# Patient Record
Sex: Female | Born: 1940 | Race: White | Hispanic: No | Marital: Married | Smoking: Never smoker
Health system: Southern US, Community
[De-identification: ages and names within clinical notes are randomized; demographics above are authoritative.]

## PROBLEM LIST (undated history)

## (undated) DIAGNOSIS — J9621 Acute and chronic respiratory failure with hypoxia: Secondary | ICD-10-CM

## (undated) DIAGNOSIS — N184 Chronic kidney disease, stage 4 (severe): Secondary | ICD-10-CM

## (undated) DIAGNOSIS — N189 Chronic kidney disease, unspecified: Secondary | ICD-10-CM

## (undated) DIAGNOSIS — E119 Type 2 diabetes mellitus without complications: Secondary | ICD-10-CM

## (undated) DIAGNOSIS — I482 Chronic atrial fibrillation, unspecified: Secondary | ICD-10-CM

## (undated) DIAGNOSIS — I4891 Unspecified atrial fibrillation: Secondary | ICD-10-CM

## (undated) DIAGNOSIS — R0489 Hemorrhage from other sites in respiratory passages: Secondary | ICD-10-CM

## (undated) DIAGNOSIS — I5033 Acute on chronic diastolic (congestive) heart failure: Secondary | ICD-10-CM

## (undated) DIAGNOSIS — I10 Essential (primary) hypertension: Secondary | ICD-10-CM

## (undated) HISTORY — PX: EYE SURGERY: SHX253

## (undated) HISTORY — PX: ATRIAL ABLATION SURGERY: SHX560

## (undated) HISTORY — PX: THYROID SURGERY: SHX805

---

## 2018-07-01 ENCOUNTER — Other Ambulatory Visit (HOSPITAL_COMMUNITY): Payer: No Typology Code available for payment source

## 2018-07-01 ENCOUNTER — Inpatient Hospital Stay
Admission: RE | Admit: 2018-07-01 | Discharge: 2018-07-02 | Disposition: A | Discharge status: Other Acute Inpatient Hospital | Payer: No Typology Code available for payment source | Source: Home / Self Care | Attending: Internal Medicine | Admitting: Internal Medicine

## 2018-07-01 DIAGNOSIS — Z9911 Dependence on respirator [ventilator] status: Secondary | ICD-10-CM

## 2018-07-01 DIAGNOSIS — I129 Hypertensive chronic kidney disease with stage 1 through stage 4 chronic kidney disease, or unspecified chronic kidney disease: Secondary | ICD-10-CM | POA: Diagnosis not present

## 2018-07-01 DIAGNOSIS — N189 Chronic kidney disease, unspecified: Secondary | ICD-10-CM | POA: Diagnosis not present

## 2018-07-01 DIAGNOSIS — R140 Abdominal distension (gaseous): Secondary | ICD-10-CM | POA: Diagnosis present

## 2018-07-01 DIAGNOSIS — Z931 Gastrostomy status: Secondary | ICD-10-CM

## 2018-07-01 DIAGNOSIS — J9690 Respiratory failure, unspecified, unspecified whether with hypoxia or hypercapnia: Secondary | ICD-10-CM

## 2018-07-01 DIAGNOSIS — E1122 Type 2 diabetes mellitus with diabetic chronic kidney disease: Secondary | ICD-10-CM | POA: Diagnosis not present

## 2018-07-01 DIAGNOSIS — Z90710 Acquired absence of both cervix and uterus: Secondary | ICD-10-CM

## 2018-07-01 DIAGNOSIS — K9423 Gastrostomy malfunction: Secondary | ICD-10-CM | POA: Diagnosis not present

## 2018-07-01 LAB — BLOOD GAS, ARTERIAL
Acid-Base Excess: 7.6 mmol/L — ABNORMAL HIGH (ref 0.0–2.0)
Bicarbonate: 31.1 mmol/L — ABNORMAL HIGH (ref 20.0–28.0)
FIO2: 30
O2 Saturation: 95.4 %
PEEP: 5 cmH2O
Patient temperature: 97.8
Pressure control: 14 cmH2O
RATE: 14 resp/min
pCO2 arterial: 38.8 mmHg (ref 32.0–48.0)
pH, Arterial: 7.512 — ABNORMAL HIGH (ref 7.350–7.450)
pO2, Arterial: 71 mmHg — ABNORMAL LOW (ref 83.0–108.0)

## 2018-07-01 MED ORDER — IOHEXOL 300 MG/ML  SOLN
50.0000 mL | Freq: Once | INTRAMUSCULAR | Status: AC | PRN
  Administered 2018-07-01: 50 mL via INTRATHECAL

## 2018-07-02 ENCOUNTER — Inpatient Hospital Stay
Admission: RE | Admit: 2018-07-02 | Discharge: 2018-07-28 | Disposition: A | Discharge status: Skilled Nursing Facility | Payer: Medicare Other | Source: Other Acute Inpatient Hospital | Attending: Internal Medicine | Admitting: Internal Medicine

## 2018-07-02 ENCOUNTER — Other Ambulatory Visit: Payer: Self-pay

## 2018-07-02 ENCOUNTER — Other Ambulatory Visit (HOSPITAL_COMMUNITY): Payer: No Typology Code available for payment source

## 2018-07-02 ENCOUNTER — Emergency Department (HOSPITAL_COMMUNITY)
Admission: EM | Admit: 2018-07-02 | Discharge: 2018-07-02 | Disposition: A | Discharge status: Home / Self Care | Payer: No Typology Code available for payment source | Attending: Emergency Medicine | Admitting: Emergency Medicine

## 2018-07-02 ENCOUNTER — Encounter (HOSPITAL_COMMUNITY): Payer: Self-pay | Admitting: Emergency Medicine

## 2018-07-02 DIAGNOSIS — N189 Chronic kidney disease, unspecified: Secondary | ICD-10-CM | POA: Insufficient documentation

## 2018-07-02 DIAGNOSIS — K567 Ileus, unspecified: Secondary | ICD-10-CM

## 2018-07-02 DIAGNOSIS — K668 Other specified disorders of peritoneum: Secondary | ICD-10-CM

## 2018-07-02 DIAGNOSIS — I5033 Acute on chronic diastolic (congestive) heart failure: Secondary | ICD-10-CM | POA: Diagnosis present

## 2018-07-02 DIAGNOSIS — E1122 Type 2 diabetes mellitus with diabetic chronic kidney disease: Secondary | ICD-10-CM | POA: Insufficient documentation

## 2018-07-02 DIAGNOSIS — J9621 Acute and chronic respiratory failure with hypoxia: Secondary | ICD-10-CM | POA: Diagnosis present

## 2018-07-02 DIAGNOSIS — I129 Hypertensive chronic kidney disease with stage 1 through stage 4 chronic kidney disease, or unspecified chronic kidney disease: Secondary | ICD-10-CM | POA: Insufficient documentation

## 2018-07-02 DIAGNOSIS — Z931 Gastrostomy status: Secondary | ICD-10-CM | POA: Insufficient documentation

## 2018-07-02 DIAGNOSIS — N289 Disorder of kidney and ureter, unspecified: Secondary | ICD-10-CM

## 2018-07-02 DIAGNOSIS — I482 Chronic atrial fibrillation, unspecified: Secondary | ICD-10-CM | POA: Diagnosis present

## 2018-07-02 DIAGNOSIS — R0489 Hemorrhage from other sites in respiratory passages: Secondary | ICD-10-CM | POA: Diagnosis present

## 2018-07-02 DIAGNOSIS — N184 Chronic kidney disease, stage 4 (severe): Secondary | ICD-10-CM | POA: Diagnosis present

## 2018-07-02 DIAGNOSIS — K9423 Gastrostomy malfunction: Secondary | ICD-10-CM

## 2018-07-02 DIAGNOSIS — J449 Chronic obstructive pulmonary disease, unspecified: Secondary | ICD-10-CM

## 2018-07-02 HISTORY — DX: Acute on chronic diastolic (congestive) heart failure: I50.33

## 2018-07-02 HISTORY — DX: Type 2 diabetes mellitus without complications: E11.9

## 2018-07-02 HISTORY — DX: Acute and chronic respiratory failure with hypoxia: J96.21

## 2018-07-02 HISTORY — DX: Chronic kidney disease, stage 4 (severe): N18.4

## 2018-07-02 HISTORY — DX: Unspecified atrial fibrillation: I48.91

## 2018-07-02 HISTORY — DX: Chronic atrial fibrillation, unspecified: I48.20

## 2018-07-02 HISTORY — DX: Essential (primary) hypertension: I10

## 2018-07-02 HISTORY — DX: Hemorrhage from other sites in respiratory passages: R04.89

## 2018-07-02 HISTORY — DX: Chronic kidney disease, unspecified: N18.9

## 2018-07-02 LAB — CBC WITH DIFFERENTIAL/PLATELET
Abs Immature Granulocytes: 0.55 10*3/uL — ABNORMAL HIGH (ref 0.00–0.07)
Basophils Absolute: 0.1 10*3/uL (ref 0.0–0.1)
Basophils Relative: 0 %
Eosinophils Absolute: 0.8 10*3/uL — ABNORMAL HIGH (ref 0.0–0.5)
Eosinophils Relative: 5 %
HCT: 27.8 % — ABNORMAL LOW (ref 36.0–46.0)
Hemoglobin: 8.6 g/dL — ABNORMAL LOW (ref 12.0–15.0)
Immature Granulocytes: 4 %
Lymphocytes Relative: 11 %
Lymphs Abs: 1.6 10*3/uL (ref 0.7–4.0)
MCH: 29.2 pg (ref 26.0–34.0)
MCHC: 30.9 g/dL (ref 30.0–36.0)
MCV: 94.2 fL (ref 80.0–100.0)
Monocytes Absolute: 0.9 10*3/uL (ref 0.1–1.0)
Monocytes Relative: 6 %
Neutro Abs: 10.8 10*3/uL — ABNORMAL HIGH (ref 1.7–7.7)
Neutrophils Relative %: 74 %
Platelets: 362 10*3/uL (ref 150–400)
RBC: 2.95 MIL/uL — ABNORMAL LOW (ref 3.87–5.11)
RDW: 16.3 % — ABNORMAL HIGH (ref 11.5–15.5)
WBC: 14.6 10*3/uL — ABNORMAL HIGH (ref 4.0–10.5)
nRBC: 0 % (ref 0.0–0.2)

## 2018-07-02 LAB — COMPREHENSIVE METABOLIC PANEL
ALT: 22 U/L (ref 0–44)
AST: 19 U/L (ref 15–41)
Albumin: 2.3 g/dL — ABNORMAL LOW (ref 3.5–5.0)
Alkaline Phosphatase: 64 U/L (ref 38–126)
Anion gap: 15 (ref 5–15)
BUN: 45 mg/dL — ABNORMAL HIGH (ref 8–23)
CO2: 26 mmol/L (ref 22–32)
Calcium: 9.4 mg/dL (ref 8.9–10.3)
Chloride: 97 mmol/L — ABNORMAL LOW (ref 98–111)
Creatinine, Ser: 1.88 mg/dL — ABNORMAL HIGH (ref 0.44–1.00)
GFR calc Af Amer: 30 mL/min — ABNORMAL LOW (ref >60)
GFR calc non Af Amer: 25 mL/min — ABNORMAL LOW (ref >60)
Glucose, Bld: 147 mg/dL — ABNORMAL HIGH (ref 70–99)
Potassium: 3.7 mmol/L (ref 3.5–5.1)
Sodium: 138 mmol/L (ref 135–145)
Total Bilirubin: 0.9 mg/dL (ref 0.3–1.2)
Total Protein: 5.9 g/dL — ABNORMAL LOW (ref 6.5–8.1)

## 2018-07-02 LAB — PROTIME-INR
INR: 1.4 — ABNORMAL HIGH (ref 0.8–1.2)
Prothrombin Time: 16.7 seconds — ABNORMAL HIGH (ref 11.4–15.2)

## 2018-07-02 LAB — MAGNESIUM: Magnesium: 2.1 mg/dL (ref 1.7–2.4)

## 2018-07-02 LAB — PHOSPHORUS: Phosphorus: 3.7 mg/dL (ref 2.5–4.6)

## 2018-07-02 MED ORDER — PRAVASTATIN SODIUM 40 MG PO TABS
40.00 | ORAL_TABLET | ORAL | Status: DC
Start: 2018-07-01 — End: 2018-07-02

## 2018-07-02 MED ORDER — INSULIN GLARGINE 100 UNIT/ML ~~LOC~~ SOLN
1.00 | SUBCUTANEOUS | Status: DC
Start: ? — End: 2018-07-02

## 2018-07-02 MED ORDER — HEPARIN LOCK FLUSH 10 UNIT/ML IV SOLN
5.00 | INTRAVENOUS | Status: DC
Start: 2018-07-02 — End: 2018-07-02

## 2018-07-02 MED ORDER — GENERIC EXTERNAL MEDICATION
250.00 | Status: DC
Start: 2018-07-01 — End: 2018-07-02

## 2018-07-02 MED ORDER — TIMOLOL MALEATE 0.5 % OP SOLN
1.00 | OPHTHALMIC | Status: DC
Start: 2018-07-01 — End: 2018-07-02

## 2018-07-02 MED ORDER — FENOFIBRATE 54 MG PO TABS
54.00 | ORAL_TABLET | ORAL | Status: DC
Start: 2018-07-02 — End: 2018-07-02

## 2018-07-02 MED ORDER — APIXABAN 2.5 MG PO TABS
2.50 | ORAL_TABLET | ORAL | Status: DC
Start: 2018-07-01 — End: 2018-07-02

## 2018-07-02 MED ORDER — POLYETHYLENE GLYCOL 3350 17 G PO PACK
17.00 | PACK | ORAL | Status: DC
Start: ? — End: 2018-07-02

## 2018-07-02 MED ORDER — CLOTRIMAZOLE-BETAMETHASONE 1-0.05 % EX CREA
TOPICAL_CREAM | CUTANEOUS | Status: DC
Start: 2018-07-01 — End: 2018-07-02

## 2018-07-02 MED ORDER — LIDOCAINE HCL 2 % IJ SOLN
0.00 | INTRAMUSCULAR | Status: DC
Start: ? — End: 2018-07-02

## 2018-07-02 MED ORDER — GENERIC EXTERNAL MEDICATION
Status: DC
Start: ? — End: 2018-07-02

## 2018-07-02 MED ORDER — DILTIAZEM HCL 30 MG PO TABS
30.00 | ORAL_TABLET | ORAL | Status: DC
Start: 2018-07-01 — End: 2018-07-02

## 2018-07-02 MED ORDER — VITAMIN D3 25 MCG (1000 UT) PO TABS
1000.00 | ORAL_TABLET | ORAL | Status: DC
Start: 2018-07-02 — End: 2018-07-02

## 2018-07-02 MED ORDER — INSULIN GLARGINE 100 UNIT/ML ~~LOC~~ SOLN
1.00 | SUBCUTANEOUS | Status: DC
Start: 2018-07-01 — End: 2018-07-02

## 2018-07-02 MED ORDER — ACETAMINOPHEN 325 MG PO TABS
650.00 | ORAL_TABLET | ORAL | Status: DC
Start: ? — End: 2018-07-02

## 2018-07-02 MED ORDER — NITROGLYCERIN 0.4 MG SL SUBL
.40 | SUBLINGUAL_TABLET | SUBLINGUAL | Status: DC
Start: ? — End: 2018-07-02

## 2018-07-02 MED ORDER — FIRST-LANSOPRAZOLE 3 MG/ML PO SUSP
30.00 | ORAL | Status: DC
Start: 2018-07-02 — End: 2018-07-02

## 2018-07-02 MED ORDER — HYDRALAZINE HCL 50 MG PO TABS
100.00 | ORAL_TABLET | ORAL | Status: DC
Start: 2018-07-01 — End: 2018-07-02

## 2018-07-02 MED ORDER — METOPROLOL TARTRATE 5 MG/5ML IV SOLN
5.00 | INTRAVENOUS | Status: DC
Start: ? — End: 2018-07-02

## 2018-07-02 MED ORDER — DEXMEDETOMIDINE HCL IN NACL 400 MCG/100ML IV SOLN
0.10 | INTRAVENOUS | Status: DC
Start: ? — End: 2018-07-02

## 2018-07-02 MED ORDER — SENNA-DOCUSATE SODIUM 8.6-50 MG PO TABS
1.00 | ORAL_TABLET | ORAL | Status: DC
Start: ? — End: 2018-07-02

## 2018-07-02 MED ORDER — GENERIC EXTERNAL MEDICATION
30.00 | Status: DC
Start: ? — End: 2018-07-02

## 2018-07-02 MED ORDER — CLONAZEPAM 0.5 MG PO TABS
.50 | ORAL_TABLET | ORAL | Status: DC
Start: 2018-07-01 — End: 2018-07-02

## 2018-07-02 MED ORDER — GUAIFENESIN 100 MG/5ML PO LIQD
400.00 | ORAL | Status: DC
Start: 2018-07-01 — End: 2018-07-02

## 2018-07-02 MED ORDER — IPRATROPIUM-ALBUTEROL 0.5-2.5 (3) MG/3ML IN SOLN
3.00 | RESPIRATORY_TRACT | Status: DC
Start: ? — End: 2018-07-02

## 2018-07-02 MED ORDER — ACETAMINOPHEN 650 MG RE SUPP
650.00 | RECTAL | Status: DC
Start: ? — End: 2018-07-02

## 2018-07-02 MED ORDER — MIDAZOLAM HCL 2 MG/2ML IJ SOLN
2.00 | INTRAMUSCULAR | Status: DC
Start: ? — End: 2018-07-02

## 2018-07-02 MED ORDER — LATANOPROST 0.005 % OP SOLN
1.00 | OPHTHALMIC | Status: DC
Start: 2018-07-01 — End: 2018-07-02

## 2018-07-02 MED ORDER — INSULIN LISPRO 100 UNIT/ML ~~LOC~~ SOLN
1.00 | SUBCUTANEOUS | Status: DC
Start: ? — End: 2018-07-02

## 2018-07-02 MED ORDER — NYSTATIN 100000 UNIT/ML MT SUSP
500000.00 | OROMUCOSAL | Status: DC
Start: 2018-07-01 — End: 2018-07-02

## 2018-07-02 MED ORDER — TROPICAL LIQUID NUTRITION PO LIQD
5.00 | ORAL | Status: DC
Start: 2018-07-02 — End: 2018-07-02

## 2018-07-02 MED ORDER — CARVEDILOL 6.25 MG PO TABS
6.25 | ORAL_TABLET | ORAL | Status: DC
Start: 2018-07-01 — End: 2018-07-02

## 2018-07-02 MED ORDER — FENTANYL CITRATE (PF) 2500 MCG/50ML IJ SOLN
25.00 | INTRAMUSCULAR | Status: DC
Start: ? — End: 2018-07-02

## 2018-07-02 MED ORDER — FUROSEMIDE 10 MG/ML IJ SOLN
60.00 | INTRAMUSCULAR | Status: DC
Start: 2018-07-01 — End: 2018-07-02

## 2018-07-02 MED ORDER — DEXTROSE 10 % IV SOLN
50.00 | INTRAVENOUS | Status: DC
Start: ? — End: 2018-07-02

## 2018-07-02 MED ORDER — ALBUTEROL SULFATE (2.5 MG/3ML) 0.083% IN NEBU
2.50 | INHALATION_SOLUTION | RESPIRATORY_TRACT | Status: DC
Start: ? — End: 2018-07-02

## 2018-07-02 MED ORDER — ONDANSETRON HCL 4 MG/2ML IJ SOLN
4.00 | INTRAMUSCULAR | Status: DC
Start: ? — End: 2018-07-02

## 2018-07-02 MED ORDER — INSULIN LISPRO 100 UNIT/ML ~~LOC~~ SOLN
1.00 | SUBCUTANEOUS | Status: DC
Start: 2018-07-01 — End: 2018-07-02

## 2018-07-02 MED ORDER — SODIUM CHLORIDE 0.9 % IV SOLN
10.00 | INTRAVENOUS | Status: DC
Start: ? — End: 2018-07-02

## 2018-07-02 MED ORDER — MELATONIN 3 MG PO TABS
3.00 | ORAL_TABLET | ORAL | Status: DC
Start: ? — End: 2018-07-02

## 2018-07-02 MED ORDER — FOLIC ACID 1 MG PO TABS
.50 | ORAL_TABLET | ORAL | Status: DC
Start: 2018-07-02 — End: 2018-07-02

## 2018-07-02 NOTE — Consult Note (Signed)
Reason for Consult: free air Referring Physician: Dairl Ponder [name] is an 78 y.o. female.  HPI: 78 yo female with long standing heart failure who has been hospitalized at [facility] and then [place] for more than a month at each this year. She was recently transferred to [facility] for long term acute care. On initial g tube study free air was identified. The team ordered a follow up CT scan showing free air around the liver without any specific findings of wall thickening of bowel or fluid. She was transferred to the ER for this finding. Due to EMR limitations, it is unclear what her vitals and laboratory values were over the last 24h.  Past Medical History:  Diagnosis Date   Atrial fibrillation ([place])    Chronic kidney disease (CKD)    Diabetes mellitus ([place])    Hypertension     Past Surgical History:  Procedure Laterality Date   ATRIAL ABLATION SURGERY     EYE SURGERY     THYROID SURGERY      No family history on file.  Social History:  reports that she has never smoked. She has never used smokeless tobacco. She reports that she does not drink alcohol or use drugs.  Allergies:  Allergies  Allergen Reactions   Cefazolin    Saxagliptin    Sulfamethoxazole    Zosyn [Piperacillin Sod-Tazobactam So]     Medications: I have reviewed the patient's current medications.  Results for orders placed or performed during the hospital encounter of 07/01/18 (from the past 48 hour(s))  Blood gas, arterial     Status: Abnormal   Collection Time: 07/01/18  5:55 PM  Result Value Ref Range   FIO2 30.00    Delivery systems VENTILATOR    Mode PRESSURE CONTROL    LHR 14 resp/min   Peep/cpap 5.0 cm H20   Pressure control 14 cm H20   pH, Arterial 7.512 (H) 7.350 - 7.450   pCO2 arterial 38.8 32.0 - 48.0 mmHg   pO2, Arterial 71.0 (L) 83.0 - 108.0 mmHg   Bicarbonate 31.1 (H) 20.0 - 28.0 mmol/L   Acid-Base Excess 7.6 (H) 0.0 - 2.0 mmol/L   O2 Saturation 95.4 %   Patient temperature 97.8    Collection site LEFT RADIAL    Drawn by COLLECTED BY RT    Sample type ARTERIAL DRAW    Allens test (pass/fail) PASS PASS  Comprehensive metabolic panel     Status: Abnormal   Collection Time: 07/02/18  5:28 AM  Result Value Ref Range   Sodium 138 135 - 145 mmol/L   Potassium 3.7 3.5 - 5.1 mmol/L   Chloride 97 (L) 98 - 111 mmol/L   CO2 26 22 - 32 mmol/L   Glucose, Bld 147 (H) 70 - 99 mg/dL   BUN 45 (H) 8 - 23 mg/dL   Creatinine, Ser 1.88 (H) 0.44 - 1.00 mg/dL   Calcium 9.4 8.9 - 10.3 mg/dL   Total Protein 5.9 (L) 6.5 - 8.1 g/dL   Albumin 2.3 (L) 3.5 - 5.0 g/dL   AST 19 15 - 41 U/L   ALT 22 0 - 44 U/L   Alkaline Phosphatase 64 38 - 126 U/L   Total Bilirubin 0.9 0.3 - 1.2 mg/dL   GFR calc non Af Amer 25 (L) >60 mL/min   GFR calc Af Amer 30 (L) >60 mL/min   Anion gap 15 5 - 15    Comment: Performed at [facility], [address] [place], [place] [address]  CBC with Differential/Platelet     Status: Abnormal   Collection Time: 07/02/18  5:28 AM  Result Value Ref Range   WBC 14.6 (H) 4.0 - 10.5 K/uL   RBC 2.95 (L) 3.87 - 5.11 MIL/uL   Hemoglobin 8.6 (L) 12.0 - 15.0 g/dL   HCT 27.8 (L) 36.0 - 46.0 %   MCV 94.2 80.0 - 100.0 fL   MCH 29.2 26.0 - 34.0 pg   MCHC 30.9 30.0 - 36.0 g/dL   RDW 16.3 (H) 11.5 - 15.5 %   Platelets 362 150 - 400 K/uL   nRBC 0.0 0.0 - 0.2 %   Neutrophils Relative % 74 %   Neutro Abs 10.8 (H) 1.7 - 7.7 K/uL   Lymphocytes Relative 11 %   Lymphs Abs 1.6 0.7 - 4.0 K/uL   Monocytes Relative 6 %   Monocytes Absolute 0.9 0.1 - 1.0 K/uL   Eosinophils Relative 5 %   Eosinophils Absolute 0.8 (H) 0.0 - 0.5 K/uL   Basophils Relative 0 %   Basophils Absolute 0.1 0.0 - 0.1 K/uL   Immature Granulocytes 4 %   Abs Immature Granulocytes 0.55 (H) 0.00 - 0.07 K/uL    Comment: Performed at [facility] [address] [place], [place] [address]  Protime-INR     Status: Abnormal   Collection Time: 07/02/18  5:28 AM  Result  Value Ref Range   Prothrombin Time 16.7 (H) 11.4 - 15.2 seconds   INR 1.4 (H) 0.8 - 1.2    Comment: (NOTE) INR goal varies based on device and disease states. Performed at [facility], [place] [address] [place], [place] [address]   Magnesium     Status: None   Collection Time: 07/02/18  5:28 AM  Result Value Ref Range   Magnesium 2.1 1.7 - 2.4 mg/dL    Comment: Performed at [facility] [address] [place], [place] [address]  Phosphorus     Status: None   Collection Time: 07/02/18  5:28 AM  Result Value Ref Range   Phosphorus 3.7 2.5 - 4.6 mg/dL    Comment: Performed at [facility] [address], [place], [place] [address]    Ct Abdomen Pelvis Wo Contrast  Result Date: 07/02/2018 CLINICAL DATA:  Follow-up KUB, abdominal distension. EXAM: CT ABDOMEN AND PELVIS WITHOUT CONTRAST TECHNIQUE: Multidetector CT imaging of the abdomen and pelvis was performed following the standard protocol without IV contrast. COMPARISON:  Plain film of the abdomen dated 07/01/2018. FINDINGS: Lower chest: Patchy bibasilar consolidations, LEFT greater than RIGHT. Hepatobiliary: Single small stone within the otherwise normal-appearing gallbladder. No focal liver abnormality identified. No bile duct dilatation. Pancreas: Unremarkable. No pancreatic ductal dilatation or surrounding inflammatory changes. Spleen: Normal in size without focal abnormality. Adrenals/Urinary Tract: Kidneys are unremarkable without mass, stone or hydronephrosis. Bladder is decompressed by Foley catheter. Stomach/Bowel: No dilated large or small bowel loops. Diverticulosis of the sigmoid colon without convincing evidence of acute diverticulitis. Gastrostomy tube in place, appropriately positioned within the stomach, at the level of the gastric body Vascular/Lymphatic: Extensive aortic atherosclerosis. No enlarged lymph nodes appreciated in the abdomen or pelvis. Reproductive: Uterus and bilateral adnexa are unremarkable.  Other: Small amount of free fluid in the lower pelvis. Large collection of free intraperitoneal air within the upper abdomen, most prominent over the dome of the liver. Musculoskeletal: No acute or suspicious osseous finding. Ill-defined fluid/edema within the subcutaneous soft tissues of  the abdomen and pelvis indicating some degree of anasarca. IMPRESSION: 1. Large collection of free intraperitoneal air within the upper abdomen, most prominent over the dome of the liver. This free intraperitoneal could certainly be related to a recently placed gastrostomy tube, however, there is also free air about the liver hilum raising the possibility of an additional/new bowel perforation at the level of the pylorus or duodenal bulb. Moreover, if the gastrostomy tube has not been placed within the last few days, the amount of air overlying the liver would be considered atypically prominent. 2. Gastrostomy tube in place, appropriately positioned within the stomach, at the level of the gastric body. 3. Colonic diverticulosis without evidence of acute diverticulitis. 4. Cholelithiasis without evidence of acute cholecystitis. 5. Patchy bibasilar consolidations, left greater than right, pneumonia versus aspiration. 6. Anasarca. Aortic Atherosclerosis (ICD10-I70.0). These results were called by telephone at the time of interpretation on 07/02/2018 at 3:05 pm to Dr. Owens Shark, who verbally acknowledged these results. Electronically Signed   By: Franki Cabot M.D.   On: 07/02/2018 15:08   Dg Chest Port 1 View  Result Date: 07/01/2018 CLINICAL DATA:  Cough.  Line placement. EXAM: PORTABLE CHEST 1 VIEW COMPARISON:  None FINDINGS: There is a right-sided PICC line with tip terminating near the cavoatrial junction. The tracheostomy tube terminates above the carina. There is a dual chamber pacemaker in place that appears appropriate. T the cardiac silhouette is enlarged. There is a left basilar airspace opacity. No pneumothorax. There is  free air under the hemidiaphragms. IMPRESSION: 1. Free air under the hemidiaphragms highly suspicious for pneumoperitoneum. Follow-up with CT scan is recommended. 2. Well-positioned right-sided PICC line. The tracheostomy tube terminates above the carina. 3. Cardiomegaly. 4. Left basilar airspace opacity which may represent atelectasis and/or a left-sided pleural effusion. These results were called by telephone at the time of interpretation on 07/01/2018 at 9:05 pm to [place] who verbally acknowledged these results. Electronically Signed   By: Constance Holster M.D.   On: 07/01/2018 21:08   Dg Abd Portable 1v  Result Date: 07/01/2018 CLINICAL DATA:  Peg tube placement EXAM: PORTABLE ABDOMEN - 1 VIEW COMPARISON:  None. FINDINGS: Injection of contrast through the pre-existing PEG tube opacifies the patient's stomach. Multiple bowel loops project over the patient's left lateral abdomen which is likely secondary to significant patient rotation. There appears to be a Rigler's sign involving the gastric wall. IMPRESSION: 1. Potential Rigler's sign involving the gastric wall, highly suspicious for pneumoperitoneum. If there is clinical concern for pneumoperitoneum follow-up with CT is recommended. Correlation with PEG tube placement timing is also recommended as pneumoperitoneum cannot be seen in the post PEG tube placement patient. 2. Injection of contrast through the PEG tube opacifies the stomach. These results were called by telephone at the time of interpretation on 07/01/2018 at 9:03 pm to RN Sheppard Coil who verbally acknowledged these results. Electronically Signed   By: Constance Holster M.D.   On: 07/01/2018 21:00    Review of Systems  Unable to perform ROS: Acuity of condition   Blood pressure (!) 154/97, pulse (!) 129, temperature 98.6 F (37 C), temperature source Oral, resp. rate (!) 22, SpO2 96 %. Physical Exam  Constitutional: She appears well-developed and well-nourished. No  distress.  HENT:  Head: Normocephalic and atraumatic.  Eyes: EOM are normal. Right eye exhibits no discharge. Left eye exhibits no discharge.  Neck: Normal range of motion. Neck supple.  Tracheostomy in place  Cardiovascular: An irregularly irregular rhythm present.  Respiratory: Breath sounds normal.  Assisted with ventilator  GI: Soft.  g tube in place, no erythema  Neurological:  Shakes yes and no to simple questions  Skin: Skin is warm and dry.  Edema in all extremities  Psychiatric:  Somnolent but arousable   Imaging: Reviewed CT scan 5/23 with distended stomach with contrast and free air in upper abdomen. Small simple fluid in pelvis. No inflammation of small or large intestine on CT.  XR 5/22 showing no extravasation of contrast on initial image  Assessment/Plan: 78 yo female recently transferred to [facility] for heart failure and respiratory failure found to have free air. -recommend g tube to drainage -recommend repeat g tube FL study in 24h -may try antibiotics if this is a change from baseline. -ok to return to [facility] for monitoring of this  [place] 07/02/2018, 7:02 PM

## 2018-07-02 NOTE — Discharge Instructions (Addendum)
General surgery recommends putting a G-tube to drainage, repeating a g-tube fluoroscopy study tomorrow, and consider antibiotics.  Send back to the emergency room if she has worsening symptoms.

## 2018-07-02 NOTE — ED Triage Notes (Signed)
[facility] from [facility]. [facility] for respiratory failure. Post CT scan today findings of collection of free air in the upper abdomen.

## 2018-07-02 NOTE — ED Provider Notes (Signed)
[facility] EMERGENCY DEPARTMENT Provider Note   CSN: [id number] Arrival date & time: 07/02/18  1738    History   Chief Complaint Chief Complaint  Patient presents with   Abdominal Injury    HPI Loretta Bell is a 78 y.o. female.     Patient is a 78 year old female who [facility] for free air in the abdomen.  She has a history of atrial fibrillation, chronic kidney disease, diabetes, hypertension.  She had a recent admission for bacteremia to [place].  At that point she had respiratory failure requiring intubation and was unable to wean off the vent.  She had a trach and PEG tube placed.  [facility].  On imaging of her G-tube, it was noted that she had possible free air in the abdomen.  CT scan confirmed these findings and she was sent here for surgical evaluation.     Past Medical History:  Diagnosis Date   Atrial fibrillation ([place])    Chronic kidney disease (CKD)    Diabetes mellitus ([place])    Hypertension     There are no active problems to display for this patient.   Past Surgical History:  Procedure Laterality Date   ATRIAL ABLATION SURGERY     EYE SURGERY     THYROID SURGERY       OB History   No obstetric history on file.      Home Medications    Prior to Admission medications   Not on File    Family History No family history on file.  Social History Social History   Tobacco Use   Smoking status: Never Smoker   Smokeless tobacco: Never Used  Substance Use Topics   Alcohol use: Never    Frequency: Never   Drug use: Never     Allergies   Cefazolin; Saxagliptin; Sulfamethoxazole; and Zosyn [piperacillin sod-tazobactam so]   Review of Systems Review of Systems  Unable to perform ROS: Patient nonverbal     Physical Exam Updated Vital Signs BP (!) 154/97    Pulse (!) 129    Temp 98.6 F (37 C) (Oral)    Resp (!)  22    SpO2 96%   Physical Exam HENT:     Head: Normocephalic and atraumatic.     Mouth/Throat:     Mouth: Mucous membranes are moist.  Neck:     Musculoskeletal: Neck supple.  Cardiovascular:     Rate and Rhythm: Tachycardia present. Rhythm irregular.     Heart sounds: Normal heart sounds.  Pulmonary:     Effort: Pulmonary effort is normal.  Abdominal:     Comments: Mild generalized tenderness, G-tube in place  Musculoskeletal:        General: Swelling present.  Skin:    General: Skin is warm and dry.  Neurological:     Mental Status: She is alert.     Comments: Patient is awake with eyes open and will nod her head to questions      ED Treatments / Results  Labs (all labs ordered are listed, but only abnormal results are displayed) Labs Reviewed - No data to display  EKG EKG Interpretation  Date/Time:  Saturday Jul 02 2018 18:01:29 EDT Ventricular Rate:  118 PR Interval:    QRS Duration: 109 QT Interval:  289 QTC Calculation: 405 R Axis:   110 Text Interpretation:  Atrial fibrillation Ventricular premature complex Right  axis deviation Low voltage, precordial leads Anteroseptal infarct, old Borderline repolarization abnormality No old tracing to compare Confirmed by Malvin Johns [phone]) on 07/02/2018 6:03:42 PM   Radiology Ct Abdomen Pelvis Wo Contrast  Result Date: 07/02/2018 CLINICAL DATA:  Follow-up KUB, abdominal distension. EXAM: CT ABDOMEN AND PELVIS WITHOUT CONTRAST TECHNIQUE: Multidetector CT imaging of the abdomen and pelvis was performed following the standard protocol without IV contrast. COMPARISON:  Plain film of the abdomen dated 07/01/2018. FINDINGS: Lower chest: Patchy bibasilar consolidations, LEFT greater than RIGHT. Hepatobiliary: Single small stone within the otherwise normal-appearing gallbladder. No focal liver abnormality identified. No bile duct dilatation. Pancreas: Unremarkable. No pancreatic ductal dilatation or surrounding inflammatory  changes. Spleen: Normal in size without focal abnormality. Adrenals/Urinary Tract: Kidneys are unremarkable without mass, stone or hydronephrosis. Bladder is decompressed by Foley catheter. Stomach/Bowel: No dilated large or small bowel loops. Diverticulosis of the sigmoid colon without convincing evidence of acute diverticulitis. Gastrostomy tube in place, appropriately positioned within the stomach, at the level of the gastric body Vascular/Lymphatic: Extensive aortic atherosclerosis. No enlarged lymph nodes appreciated in the abdomen or pelvis. Reproductive: Uterus and bilateral adnexa are unremarkable. Other: Small amount of free fluid in the lower pelvis. Large collection of free intraperitoneal air within the upper abdomen, most prominent over the dome of the liver. Musculoskeletal: No acute or suspicious osseous finding. Ill-defined fluid/edema within the subcutaneous soft tissues of the abdomen and pelvis indicating some degree of anasarca. IMPRESSION: 1. Large collection of free intraperitoneal air within the upper abdomen, most prominent over the dome of the liver. This free intraperitoneal could certainly be related to a recently placed gastrostomy tube, however, there is also free air about the liver hilum raising the possibility of an additional/new bowel perforation at the level of the pylorus or duodenal bulb. Moreover, if the gastrostomy tube has not been placed within the last few days, the amount of air overlying the liver would be considered atypically prominent. 2. Gastrostomy tube in place, appropriately positioned within the stomach, at the level of the gastric body. 3. Colonic diverticulosis without evidence of acute diverticulitis. 4. Cholelithiasis without evidence of acute cholecystitis. 5. Patchy bibasilar consolidations, left greater than right, pneumonia versus aspiration. 6. Anasarca. Aortic Atherosclerosis (ICD10-I70.0). These results were called by telephone at the time of  interpretation on 07/02/2018 at 3:05 pm to Dr. Owens Shark, who verbally acknowledged these results. Electronically Signed   By: Franki Cabot M.D.   On: 07/02/2018 15:08   Dg Chest Port 1 View  Result Date: 07/01/2018 CLINICAL DATA:  Cough.  Line placement. EXAM: PORTABLE CHEST 1 VIEW COMPARISON:  None FINDINGS: There is a right-sided PICC line with tip terminating near the cavoatrial junction. The tracheostomy tube terminates above the carina. There is a dual chamber pacemaker in place that appears appropriate. T the cardiac silhouette is enlarged. There is a left basilar airspace opacity. No pneumothorax. There is free air under the hemidiaphragms. IMPRESSION: 1. Free air under the hemidiaphragms highly suspicious for pneumoperitoneum. Follow-up with CT scan is recommended. 2. Well-positioned right-sided PICC line. The tracheostomy tube terminates above the carina. 3. Cardiomegaly. 4. Left basilar airspace opacity which may represent atelectasis and/or a left-sided pleural effusion. These results were called by telephone at the time of interpretation on 07/01/2018 at 9:05 pm to [place] who verbally acknowledged these results. Electronically Signed   By: Constance Holster M.D.   On: 07/01/2018 21:08   Dg Abd Portable 1v  Result Date: 07/01/2018 CLINICAL DATA:  Peg  tube placement EXAM: PORTABLE ABDOMEN - 1 VIEW COMPARISON:  None. FINDINGS: Injection of contrast through the pre-existing PEG tube opacifies the patient's stomach. Multiple bowel loops project over the patient's left lateral abdomen which is likely secondary to significant patient rotation. There appears to be a Rigler's sign involving the gastric wall. IMPRESSION: 1. Potential Rigler's sign involving the gastric wall, highly suspicious for pneumoperitoneum. If there is clinical concern for pneumoperitoneum follow-up with CT is recommended. Correlation with PEG tube placement timing is also recommended as pneumoperitoneum cannot be seen  in the post PEG tube placement patient. 2. Injection of contrast through the PEG tube opacifies the stomach. These results were called by telephone at the time of interpretation on 07/01/2018 at 9:03 pm to RN Sheppard Coil who verbally acknowledged these results. Electronically Signed   By: Constance Holster M.D.   On: 07/01/2018 21:00    Procedures Procedures (including critical care time)  Medications Ordered in ED Medications - No data to display   Initial Impression / Assessment and Plan / ED Course  I have reviewed the triage vital signs and the nursing notes.  Pertinent labs & imaging results that were available during my care of the patient were reviewed by me and considered in my medical decision making (see chart for details).        [facility] with free air in the abdomen.  Surgery has seen the patient and feels that the PEG is leaking causing the free air.  It is felt the [facility] and have a repeat G-tube study tomorrow.  The G-tube is to be placed to drainage and she should return to the emergency department if she has worsening symptoms.  Her blood pressure stable.  She is tachycardic and is in atrial fibrillation which she has a known history of.  It appears that this is not a significant change for her and in fact her heart rate on admission yesterday was in the 120s per the H&P.  She is afebrile.  I did notify the nurse practitioner who is currently overseeing the patient's care of these recommendations and will [facility].  Final Clinical Impressions(s) / ED Diagnoses   Final diagnoses:  Leaking percutaneous endoscopic gastrostomy (PEG) tube [facility])    ED Discharge Orders    None       Malvin Johns, MD 07/02/18 1857

## 2018-07-04 ENCOUNTER — Other Ambulatory Visit (HOSPITAL_COMMUNITY): Payer: Medicare Other

## 2018-07-04 LAB — MAGNESIUM: Magnesium: 1.7 mg/dL (ref 1.7–2.4)

## 2018-07-04 LAB — BASIC METABOLIC PANEL
Anion gap: 12 (ref 5–15)
BUN: 34 mg/dL — ABNORMAL HIGH (ref 8–23)
CO2: 26 mmol/L (ref 22–32)
Calcium: 9 mg/dL (ref 8.9–10.3)
Chloride: 101 mmol/L (ref 98–111)
Creatinine, Ser: 1.67 mg/dL — ABNORMAL HIGH (ref 0.44–1.00)
GFR calc Af Amer: 34 mL/min — ABNORMAL LOW (ref >60)
GFR calc non Af Amer: 29 mL/min — ABNORMAL LOW (ref >60)
Glucose, Bld: 113 mg/dL — ABNORMAL HIGH (ref 70–99)
Potassium: 3 mmol/L — ABNORMAL LOW (ref 3.5–5.1)
Sodium: 139 mmol/L (ref 135–145)

## 2018-07-04 LAB — CBC
HCT: 27.2 % — ABNORMAL LOW (ref 36.0–46.0)
Hemoglobin: 8.4 g/dL — ABNORMAL LOW (ref 12.0–15.0)
MCH: 29.2 pg (ref 26.0–34.0)
MCHC: 30.9 g/dL (ref 30.0–36.0)
MCV: 94.4 fL (ref 80.0–100.0)
Platelets: 288 10*3/uL (ref 150–400)
RBC: 2.88 MIL/uL — ABNORMAL LOW (ref 3.87–5.11)
RDW: 15.7 % — ABNORMAL HIGH (ref 11.5–15.5)
WBC: 11.1 10*3/uL — ABNORMAL HIGH (ref 4.0–10.5)
nRBC: 0.2 % (ref 0.0–0.2)

## 2018-07-05 ENCOUNTER — Encounter: Payer: Self-pay | Admitting: Internal Medicine

## 2018-07-05 DIAGNOSIS — J9621 Acute and chronic respiratory failure with hypoxia: Secondary | ICD-10-CM | POA: Diagnosis not present

## 2018-07-05 DIAGNOSIS — N184 Chronic kidney disease, stage 4 (severe): Secondary | ICD-10-CM | POA: Diagnosis not present

## 2018-07-05 DIAGNOSIS — I482 Chronic atrial fibrillation, unspecified: Secondary | ICD-10-CM | POA: Diagnosis present

## 2018-07-05 DIAGNOSIS — R0489 Hemorrhage from other sites in respiratory passages: Secondary | ICD-10-CM

## 2018-07-05 DIAGNOSIS — I5033 Acute on chronic diastolic (congestive) heart failure: Secondary | ICD-10-CM | POA: Diagnosis not present

## 2018-07-05 LAB — HEMOGLOBIN A1C
Hgb A1c MFr Bld: 6.8 % — ABNORMAL HIGH (ref 4.8–5.6)
Mean Plasma Glucose: 148 mg/dL

## 2018-07-05 LAB — MAGNESIUM: Magnesium: 2 mg/dL (ref 1.7–2.4)

## 2018-07-05 LAB — POTASSIUM: Potassium: 4.3 mmol/L (ref 3.5–5.1)

## 2018-07-05 NOTE — Consult Note (Signed)
[facility]  PULMONARY SERVICE  Date of Service: 07/05/2018  PULMONARY CRITICAL CARE CONSULT   Jacquline Terrill  WUX:[id number]  DOB: 1940/09/16   DOA: 07/02/2018  Referring Physician: Merton Border, MD  HPI: Loretta Bell is a 78 y.o. female seen for follow up of Acute on Chronic Respiratory Failure.  Patient has multiple medical problems including congestive heart failure respiratory failure urinary retention sleep apnea pulmonary hypertension chronic anemia chronic kidney disease stage IV sick sinus syndrome hypertension who presented to the hospital with a prior history of aortic valve endocarditis which she had been admitted to the other hospital.  Patient at that time was found to have MSSA endocarditis.  Patient also developed acute on chronic kidney failure with requiring dialysis.  She presented to the acute care facility with an exacerbation of her congestive heart failure.  Patient was aggressively diuresed with some improvement.  She continued to require mechanical ventilation and was not able to come off of the ventilator.  There is therefore transferred to our facility for further management and weaning.  At time of evaluation patient is nonverbal on the ventilator.  Review of Systems:  ROS performed and is unremarkable other than noted above.  Past Medical History:  Diagnosis Date  . Atrial fibrillation ([place])   . Chronic kidney disease (CKD)   . Diabetes mellitus ([place])   . Hypertension     Past Surgical History:  Procedure Laterality Date  . ATRIAL ABLATION SURGERY    . EYE SURGERY    . THYROID SURGERY      Social History:    reports that she has never smoked. She has never used smokeless tobacco. She reports that she does not drink alcohol or use drugs.  Family History: Non-Contributory to the present illness  Allergies  Allergen Reactions  . Cefazolin   . Saxagliptin   . Sulfamethoxazole   . Zosyn  [Piperacillin Sod-Tazobactam So]     Medications: Reviewed on Rounds  Physical Exam:  Vitals: Temperature 98.7 pulse is 88 respiratory rate 25 saturations 98%  Ventilator Settings mode ventilation pressure support currently on 12/5 tidal volume 350  . General: Comfortable at this time . Eyes: Grossly normal lids, irises & conjunctiva . ENT: grossly tongue is normal . Neck: no obvious mass . Cardiovascular: S1-S2 normal no gallop or rub is noted . Respiratory: Coarse rhonchi noted bilaterally . Abdomen: Soft and nontender . Skin: no rash seen on limited exam . Musculoskeletal: not rigid . Psychiatric:unable to assess . Neurologic: no seizure no involuntary movements         Labs on Admission:  Basic Metabolic Panel: Recent Labs  Lab 07/02/18 0528 07/04/18 0746 07/05/18 0554  NA 138 139  --   K 3.7 3.0* 4.3  CL 97* 101  --   CO2 26 26  --   GLUCOSE 147* 113*  --   BUN 45* 34*  --   CREATININE 1.88* 1.67*  --   CALCIUM 9.4 9.0  --   MG 2.1 1.7 2.0  PHOS 3.7  --   --     Recent Labs  Lab 07/01/18 1755  PHART 7.512*  PCO2ART 38.8  PO2ART 71.0*  HCO3 31.1*  O2SAT 95.4    Liver Function Tests: Recent Labs  Lab 07/02/18 0528  AST 19  ALT 22  ALKPHOS 64  BILITOT 0.9  PROT 5.9*  ALBUMIN 2.3*   No results for input(s): LIPASE, AMYLASE in the last  168 hours. No results for input(s): AMMONIA in the last 168 hours.  CBC: Recent Labs  Lab 07/02/18 0528 07/04/18 0746  WBC 14.6* 11.1*  NEUTROABS 10.8*  --   HGB 8.6* 8.4*  HCT 27.8* 27.2*  MCV 94.2 94.4  PLT 362 288    Cardiac Enzymes: No results for input(s): CKTOTAL, CKMB, CKMBINDEX, TROPONINI in the last 168 hours.  BNP (last 3 results) No results for input(s): BNP in the last 8760 hours.  ProBNP (last 3 results) No results for input(s): PROBNP in the last 8760 hours.   Radiological Exams on Admission: Ct Abdomen Pelvis Wo Contrast  Result Date: 07/02/2018 CLINICAL DATA:  Follow-up KUB,  abdominal distension. EXAM: CT ABDOMEN AND PELVIS WITHOUT CONTRAST TECHNIQUE: Multidetector CT imaging of the abdomen and pelvis was performed following the standard protocol without IV contrast. COMPARISON:  Plain film of the abdomen dated 07/01/2018. FINDINGS: Lower chest: Patchy bibasilar consolidations, LEFT greater than RIGHT. Hepatobiliary: Single small stone within the otherwise normal-appearing gallbladder. No focal liver abnormality identified. No bile duct dilatation. Pancreas: Unremarkable. No pancreatic ductal dilatation or surrounding inflammatory changes. Spleen: Normal in size without focal abnormality. Adrenals/Urinary Tract: Kidneys are unremarkable without mass, stone or hydronephrosis. Bladder is decompressed by Foley catheter. Stomach/Bowel: No dilated large or small bowel loops. Diverticulosis of the sigmoid colon without convincing evidence of acute diverticulitis. Gastrostomy tube in place, appropriately positioned within the stomach, at the level of the gastric body Vascular/Lymphatic: Extensive aortic atherosclerosis. No enlarged lymph nodes appreciated in the abdomen or pelvis. Reproductive: Uterus and bilateral adnexa are unremarkable. Other: Small amount of free fluid in the lower pelvis. Large collection of free intraperitoneal air within the upper abdomen, most prominent over the dome of the liver. Musculoskeletal: No acute or suspicious osseous finding. Ill-defined fluid/edema within the subcutaneous soft tissues of the abdomen and pelvis indicating some degree of anasarca. IMPRESSION: 1. Large collection of free intraperitoneal air within the upper abdomen, most prominent over the dome of the liver. This free intraperitoneal could certainly be related to a recently placed gastrostomy tube, however, there is also free air about the liver hilum raising the possibility of an additional/new bowel perforation at the level of the pylorus or duodenal bulb. Moreover, if the gastrostomy tube  has not been placed within the last few days, the amount of air overlying the liver would be considered atypically prominent. 2. Gastrostomy tube in place, appropriately positioned within the stomach, at the level of the gastric body. 3. Colonic diverticulosis without evidence of acute diverticulitis. 4. Cholelithiasis without evidence of acute cholecystitis. 5. Patchy bibasilar consolidations, left greater than right, pneumonia versus aspiration. 6. Anasarca. Aortic Atherosclerosis (ICD10-I70.0). These results were called by telephone at the time of interpretation on 07/02/2018 at 3:05 pm to Dr. Owens Shark, who verbally acknowledged these results. Electronically Signed   By: Franki Cabot M.D.   On: 07/02/2018 15:08   Dg Abd 1 View  Result Date: 07/04/2018 CLINICAL DATA:  Ileus. Intra-abdominal free air of unknown etiology. EXAM: ABDOMEN - 1 VIEW COMPARISON:  CT 07/02/2018.  One view abdomen 07/01/2018. FINDINGS: 1303 hours. Two views are obtained. There is mild breathing artifact. The bowel gas pattern appears normal. There is no supine evidence of free intraperitoneal air. Patient has a percutaneous G-tube and pacemaker. Opacity at the left lung base corresponds with scarring and pleural thickening on CT. A small amount of contrast material is present in the right colon. No extraluminal contrast identified. IMPRESSION: Normal bowel gas pattern. No supine  evidence of residual pneumoperitoneum. Electronically Signed   By: Richardean Sale M.D.   On: 07/04/2018 13:41    Assessment/Plan Active Problems:   Acute on chronic respiratory failure with hypoxia (HCC)   Chronic atrial fibrillation   Chronic kidney disease, stage IV (severe) (HCC)   Pulmonary alveolar hemorrhage   Acute on chronic diastolic heart failure ([place])   1. Acute on chronic respiratory failure with hypoxia at this time patient is on the ventilator and will titrate oxygen down as tolerated.  We will titrate the PEEP as tolerated and begin on  weaning protocol. 2. Chronic atrial fibrillation right now rate is controlled we will continue to monitor closely. 3. Chronic kidney disease stage IV monitor labs will continue with supportive care nephrology recommendations as necessary 4. Pulmonary hemorrhage was noted on the notes from the other facility.  We will continue with supportive care monitor radiologically. 5. Acute on chronic diastolic dysfunction preserved ejection fraction we will continue to monitor fluid status and diurese as tolerated.  I have personally seen and evaluated the patient, evaluated laboratory and imaging results, formulated the assessment and plan and placed orders. The Patient requires high complexity decision making for assessment and support.  Case was discussed on Rounds with the Respiratory Therapy Staff Time Spent 6minutes  Allyne Gee, MD [facility] Pulmonary Critical Care Medicine Sleep Medicine

## 2018-07-06 DIAGNOSIS — N184 Chronic kidney disease, stage 4 (severe): Secondary | ICD-10-CM | POA: Diagnosis not present

## 2018-07-06 DIAGNOSIS — I482 Chronic atrial fibrillation, unspecified: Secondary | ICD-10-CM | POA: Diagnosis not present

## 2018-07-06 DIAGNOSIS — I5033 Acute on chronic diastolic (congestive) heart failure: Secondary | ICD-10-CM | POA: Diagnosis not present

## 2018-07-06 DIAGNOSIS — J9621 Acute and chronic respiratory failure with hypoxia: Secondary | ICD-10-CM | POA: Diagnosis not present

## 2018-07-06 NOTE — Progress Notes (Addendum)
Pulmonary Critical Care Medicine [place]   PULMONARY CRITICAL CARE SERVICE  PROGRESS NOTE  Date of Service: 07/06/2018  Loretta Bell  [id number]:[id number]  DOB: 09/20/1940   DOA: 07/02/2018  Referring Physician: Merton Border, MD  HPI: Loretta Bell is a 78 y.o. female seen for follow up of Acute on Chronic Respiratory Failure.  Patient has a goal of 2 hours on pressure support 12/5 with an FiO2 of 28% today.  Respiratory therapy reports patient did this with no problem and is now back on [facility] with a rate of 14 and FiO2 of 28%.  Currently resting well with no distress and good saturations.  Medications: Reviewed on Rounds  Physical Exam:  Vitals: Pulse 02/10/2000 respirations 18 BP 115/58 O2 sat 100% temp 97.6  Ventilator Settings ventilator mode AC PC rate of 14 inspiratory pressure 14 PEEP of 5 FiO2 28%  . General: Comfortable at this time . Eyes: Grossly normal lids, irises & conjunctiva . ENT: grossly tongue is normal . Neck: no obvious mass . Cardiovascular: S1 S2 normal no gallop . Respiratory: Coarse breath sounds bilaterally . Abdomen: soft . Skin: no rash seen on limited exam . Musculoskeletal: not rigid . Psychiatric:unable to assess . Neurologic: no seizure no involuntary movements         Lab Data:   Basic Metabolic Panel: Recent Labs  Lab 07/02/18 0528 07/04/18 0746 07/05/18 0554  NA 138 139  --   K 3.7 3.0* 4.3  CL 97* 101  --   CO2 26 26  --   GLUCOSE 147* 113*  --   BUN 45* 34*  --   CREATININE 1.88* 1.67*  --   CALCIUM 9.4 9.0  --   MG 2.1 1.7 2.0  PHOS 3.7  --   --     ABG: Recent Labs  Lab 07/01/18 1755  PHART 7.512*  PCO2ART 38.8  PO2ART 71.0*  HCO3 31.1*  O2SAT 95.4    Liver Function Tests: Recent Labs  Lab 07/02/18 0528  AST 19  ALT 22  ALKPHOS 64  BILITOT 0.9  PROT 5.9*  ALBUMIN 2.3*   No results for input(s): LIPASE, AMYLASE in the last 168 hours. No results for input(s): AMMONIA in the  last 168 hours.  CBC: Recent Labs  Lab 07/02/18 0528 07/04/18 0746  WBC 14.6* 11.1*  NEUTROABS 10.8*  --   HGB 8.6* 8.4*  HCT 27.8* 27.2*  MCV 94.2 94.4  PLT 362 288    Cardiac Enzymes: No results for input(s): CKTOTAL, CKMB, CKMBINDEX, TROPONINI in the last 168 hours.  BNP (last 3 results) No results for input(s): BNP in the last 8760 hours.  ProBNP (last 3 results) No results for input(s): PROBNP in the last 8760 hours.  Radiological Exams: No results found.  Assessment/Plan Active Problems:   Acute on chronic respiratory failure with hypoxia (HCC)   Chronic atrial fibrillation   Chronic kidney disease, stage IV (severe) (HCC)   Pulmonary alveolar hemorrhage   Acute on chronic diastolic heart failure ([place])   1. Acute on chronic respiratory failure with hypoxia patient continues to do well with weaning..  Completed a 2-hour goal today on pressure support per respiratory.  Back of AC PC at this time with above settings..  Continue supportive measures and pulmonary toilet at this time. 2. Chronic atrial fibrillation rate controlled continue to monitor 3. Chronic kidney disease stage IV monitor labs continue with supportive care and nephrology recommendations. 4. Pulmonary hemorrhage as  noted from previous facility's notes.  Continue supportive care 5. Acute on chronic diastolic dysfunction with preserved ejection fraction continue to monitor fluid status and diurese as tolerated.   I have personally seen and evaluated the patient, evaluated laboratory and imaging results, formulated the assessment and plan and placed orders. The Patient requires high complexity decision making for assessment and support.  Case was discussed on Rounds with the Respiratory Therapy Staff  Allyne Gee, MD [facility] Pulmonary Critical Care Medicine Sleep Medicine

## 2018-07-07 DIAGNOSIS — I5033 Acute on chronic diastolic (congestive) heart failure: Secondary | ICD-10-CM | POA: Diagnosis not present

## 2018-07-07 DIAGNOSIS — N184 Chronic kidney disease, stage 4 (severe): Secondary | ICD-10-CM | POA: Diagnosis not present

## 2018-07-07 DIAGNOSIS — J9621 Acute and chronic respiratory failure with hypoxia: Secondary | ICD-10-CM | POA: Diagnosis not present

## 2018-07-07 DIAGNOSIS — I482 Chronic atrial fibrillation, unspecified: Secondary | ICD-10-CM | POA: Diagnosis not present

## 2018-07-07 NOTE — Progress Notes (Signed)
Pulmonary Critical Care Medicine [place]   PULMONARY CRITICAL CARE SERVICE  PROGRESS NOTE  Date of Service: 07/07/2018  Loretta Bell  GUY:[id number]  DOB: September 06, 1940   DOA: 07/02/2018  Referring Physician: Merton Border, MD  HPI: Loretta Bell is a 78 y.o. female seen for follow up of Acute on Chronic Respiratory Failure.  Patient is on full support the goal today is to do pressure support mode again the goal will be for 4 hours  Medications: Reviewed on Rounds  Physical Exam:  Vitals: Temperature 97.7 pulse 104 respiratory 18 blood pressure 94/56 saturations 99%  Ventilator Settings mode ventilation pressure assist control FiO2 20% tidal volume 478 PEEP 5  . General: Comfortable at this time . Eyes: Grossly normal lids, irises & conjunctiva . ENT: grossly tongue is normal . Neck: no obvious mass . Cardiovascular: S1 S2 normal no gallop . Respiratory: No rhonchi no rales are noted . Abdomen: soft . Skin: no rash seen on limited exam . Musculoskeletal: not rigid . Psychiatric:unable to assess . Neurologic: no seizure no involuntary movements         Lab Data:   Basic Metabolic Panel: Recent Labs  Lab 07/02/18 0528 07/04/18 0746 07/05/18 0554  NA 138 139  --   K 3.7 3.0* 4.3  CL 97* 101  --   CO2 26 26  --   GLUCOSE 147* 113*  --   BUN 45* 34*  --   CREATININE 1.88* 1.67*  --   CALCIUM 9.4 9.0  --   MG 2.1 1.7 2.0  PHOS 3.7  --   --     ABG: Recent Labs  Lab 07/01/18 1755  PHART 7.512*  PCO2ART 38.8  PO2ART 71.0*  HCO3 31.1*  O2SAT 95.4    Liver Function Tests: Recent Labs  Lab 07/02/18 0528  AST 19  ALT 22  ALKPHOS 64  BILITOT 0.9  PROT 5.9*  ALBUMIN 2.3*   No results for input(s): LIPASE, AMYLASE in the last 168 hours. No results for input(s): AMMONIA in the last 168 hours.  CBC: Recent Labs  Lab 07/02/18 0528 07/04/18 0746  WBC 14.6* 11.1*  NEUTROABS 10.8*  --   HGB 8.6* 8.4*  HCT 27.8* 27.2*   MCV 94.2 94.4  PLT 362 288    Cardiac Enzymes: No results for input(s): CKTOTAL, CKMB, CKMBINDEX, TROPONINI in the last 168 hours.  BNP (last 3 results) No results for input(s): BNP in the last 8760 hours.  ProBNP (last 3 results) No results for input(s): PROBNP in the last 8760 hours.  Radiological Exams: No results found.  Assessment/Plan Active Problems:   Acute on chronic respiratory failure with hypoxia (HCC)   Chronic atrial fibrillation   Chronic kidney disease, stage IV (severe) (HCC)   Pulmonary alveolar hemorrhage   Acute on chronic diastolic heart failure ([place])   1. Acute on chronic respiratory failure with hypoxia we will continue to wean as mentioned the goal is for 4 hours on pressure support today. 2. Chronic atrial fibrillation rate controlled 3. Chronic kidney disease stage IV stable we will continue to monitor 4. Pulmonary alveolar hemorrhage resolving 5. Acute on chronic diastolic heart failure monitor fluid status diuretics as necessary   I have personally seen and evaluated the patient, evaluated laboratory and imaging results, formulated the assessment and plan and placed orders. The Patient requires high complexity decision making for assessment and support.  Case was discussed on Rounds with the Respiratory Therapy Staff  Allyne Gee, MD [facility] Pulmonary Critical Care Medicine Sleep Medicine

## 2018-07-08 DIAGNOSIS — I5033 Acute on chronic diastolic (congestive) heart failure: Secondary | ICD-10-CM | POA: Diagnosis not present

## 2018-07-08 DIAGNOSIS — N184 Chronic kidney disease, stage 4 (severe): Secondary | ICD-10-CM | POA: Diagnosis not present

## 2018-07-08 DIAGNOSIS — I482 Chronic atrial fibrillation, unspecified: Secondary | ICD-10-CM | POA: Diagnosis not present

## 2018-07-08 DIAGNOSIS — J9621 Acute and chronic respiratory failure with hypoxia: Secondary | ICD-10-CM | POA: Diagnosis not present

## 2018-07-08 NOTE — Progress Notes (Addendum)
Pulmonary Critical Care Medicine [place]   PULMONARY CRITICAL CARE SERVICE  PROGRESS NOTE  Date of Service: 07/08/2018  Loretta Bell  RXV:[id number]  DOB: [birth year]   DOA: 07/02/2018  Referring Physician: Merton Border, MD  HPI: Loretta Bell is a 78 y.o. female seen for follow up of Acute on Chronic Respiratory Failure.  Patient has a 12-hour goal on pressure support today 28% FiO2.  Satting well with no distress at this time.  Medications: Reviewed on Rounds  Physical Exam:  Vitals: Pulse 117 respirations 19 BP 115/49 O2 sat 98% temp 96.0  Ventilator Settings pressure 12/5 FiO2 28%  . General: Comfortable at this time . Eyes: Grossly normal lids, irises & conjunctiva . ENT: grossly tongue is normal . Neck: no obvious mass . Cardiovascular: S1 S2 normal no gallop . Respiratory: No rales or rhonchi noted . Abdomen: soft . Skin: no rash seen on limited exam . Musculoskeletal: not rigid . Psychiatric:unable to assess . Neurologic: no seizure no involuntary movements         Lab Data:   Basic Metabolic Panel: Recent Labs  Lab 07/02/18 0528 07/04/18 0746 07/05/18 0554  NA 138 139  --   K 3.7 3.0* 4.3  CL 97* 101  --   CO2 26 26  --   GLUCOSE 147* 113*  --   BUN 45* 34*  --   CREATININE 1.88* 1.67*  --   CALCIUM 9.4 9.0  --   MG 2.1 1.7 2.0  PHOS 3.7  --   --     ABG: Recent Labs  Lab 07/01/18 1755  PHART 7.512*  PCO2ART 38.8  PO2ART 71.0*  HCO3 31.1*  O2SAT 95.4    Liver Function Tests: Recent Labs  Lab 07/02/18 0528  AST 19  ALT 22  ALKPHOS 64  BILITOT 0.9  PROT 5.9*  ALBUMIN 2.3*   No results for input(s): LIPASE, AMYLASE in the last 168 hours. No results for input(s): AMMONIA in the last 168 hours.  CBC: Recent Labs  Lab 07/02/18 0528 07/04/18 0746  WBC 14.6* 11.1*  NEUTROABS 10.8*  --   HGB 8.6* 8.4*  HCT 27.8* 27.2*  MCV 94.2 94.4  PLT 362 288    Cardiac Enzymes: No results for  input(s): CKTOTAL, CKMB, CKMBINDEX, TROPONINI in the last 168 hours.  BNP (last 3 results) No results for input(s): BNP in the last 8760 hours.  ProBNP (last 3 results) No results for input(s): PROBNP in the last 8760 hours.  Radiological Exams: No results found.  Assessment/Plan Active Problems:   Acute on chronic respiratory failure with hypoxia (HCC)   Chronic atrial fibrillation   Chronic kidney disease, stage IV (severe) (HCC)   Pulmonary alveolar hemorrhage   Acute on chronic diastolic heart failure ([place])   1. Acute on chronic respiratory failure with hypoxia we will continue to wean as mentioned the goal is for 12 hours on pressure support today. 2. Chronic atrial fibrillation rate controlled 3. Chronic kidney disease stage IV stable we will continue to monitor 4. Pulmonary alveolar hemorrhage resolving 5. Acute on chronic diastolic heart failure monitor fluid status diuretics as necessary   I have personally seen and evaluated the patient, evaluated laboratory and imaging results, formulated the assessment and plan and placed orders. The Patient requires high complexity decision making for assessment and support.  Case was discussed on Rounds with the Respiratory Therapy Staff  Allyne Gee, MD [facility] Pulmonary Critical Care Medicine Sleep Medicine

## 2018-07-09 DIAGNOSIS — I482 Chronic atrial fibrillation, unspecified: Secondary | ICD-10-CM | POA: Diagnosis not present

## 2018-07-09 DIAGNOSIS — I5033 Acute on chronic diastolic (congestive) heart failure: Secondary | ICD-10-CM | POA: Diagnosis not present

## 2018-07-09 DIAGNOSIS — J9621 Acute and chronic respiratory failure with hypoxia: Secondary | ICD-10-CM | POA: Diagnosis not present

## 2018-07-09 DIAGNOSIS — N184 Chronic kidney disease, stage 4 (severe): Secondary | ICD-10-CM | POA: Diagnosis not present

## 2018-07-09 LAB — COMPREHENSIVE METABOLIC PANEL
ALT: 39 U/L (ref 0–44)
AST: 37 U/L (ref 15–41)
Albumin: 1.9 g/dL — ABNORMAL LOW (ref 3.5–5.0)
Alkaline Phosphatase: 93 U/L (ref 38–126)
Anion gap: 14 (ref 5–15)
BUN: 34 mg/dL — ABNORMAL HIGH (ref 8–23)
CO2: 22 mmol/L (ref 22–32)
Calcium: 10 mg/dL (ref 8.9–10.3)
Chloride: 97 mmol/L — ABNORMAL LOW (ref 98–111)
Creatinine, Ser: 2.03 mg/dL — ABNORMAL HIGH (ref 0.44–1.00)
GFR calc Af Amer: 27 mL/min — ABNORMAL LOW (ref >60)
GFR calc non Af Amer: 23 mL/min — ABNORMAL LOW (ref >60)
Glucose, Bld: 132 mg/dL — ABNORMAL HIGH (ref 70–99)
Potassium: 2.8 mmol/L — ABNORMAL LOW (ref 3.5–5.1)
Sodium: 133 mmol/L — ABNORMAL LOW (ref 135–145)
Total Bilirubin: 0.6 mg/dL (ref 0.3–1.2)
Total Protein: 4.7 g/dL — ABNORMAL LOW (ref 6.5–8.1)

## 2018-07-09 LAB — CBC
HCT: 26.9 % — ABNORMAL LOW (ref 36.0–46.0)
Hemoglobin: 8.5 g/dL — ABNORMAL LOW (ref 12.0–15.0)
MCH: 29.9 pg (ref 26.0–34.0)
MCHC: 31.6 g/dL (ref 30.0–36.0)
MCV: 94.7 fL (ref 80.0–100.0)
Platelets: 204 10*3/uL (ref 150–400)
RBC: 2.84 MIL/uL — ABNORMAL LOW (ref 3.87–5.11)
RDW: 16.9 % — ABNORMAL HIGH (ref 11.5–15.5)
WBC: 9.8 10*3/uL (ref 4.0–10.5)
nRBC: 0 % (ref 0.0–0.2)

## 2018-07-09 NOTE — Progress Notes (Addendum)
Pulmonary Critical Care Medicine [place]   PULMONARY CRITICAL CARE SERVICE  PROGRESS NOTE  Date of Service: 07/09/2018  Terrance Lanahan  [id number]  DOB: 1941/01/11   DOA: 07/02/2018  Referring Physician: Merton Border, MD  HPI: Loretta Bell is a 78 y.o. female seen for follow up of Acute on Chronic Respiratory Failure.  Patient continues on pressure support 12/5 with an FiO2 of 28% has no 16 hours today.  Satting well with no distress at this time.  Medications: Reviewed on Rounds  Physical Exam:  Vitals: Pulse 113 respirations 18 BP 113/65 O2 sat 100% 96.7  Ventilator Settings pressure support 12/5 FiO2 28%  . General: Comfortable at this time . Eyes: Grossly normal lids, irises & conjunctiva . ENT: grossly tongue is normal . Neck: no obvious mass . Cardiovascular: S1 S2 normal no gallop . Respiratory: No rales or rhonchi noted . Abdomen: soft . Skin: no rash seen on limited exam . Musculoskeletal: not rigid . Psychiatric:unable to assess . Neurologic: no seizure no involuntary movements         Lab Data:   Basic Metabolic Panel: Recent Labs  Lab 07/04/18 0746 07/05/18 0554 07/09/18 0541  NA 139  --  133*  K 3.0* 4.3 2.8*  CL 101  --  97*  CO2 26  --  22  GLUCOSE 113*  --  132*  BUN 34*  --  34*  CREATININE 1.67*  --  2.03*  CALCIUM 9.0  --  10.0  MG 1.7 2.0  --     ABG: No results for input(s): PHART, PCO2ART, PO2ART, HCO3, O2SAT in the last 168 hours.  Liver Function Tests: Recent Labs  Lab 07/09/18 0541  AST 37  ALT 39  ALKPHOS 93  BILITOT 0.6  PROT 4.7*  ALBUMIN 1.9*   No results for input(s): LIPASE, AMYLASE in the last 168 hours. No results for input(s): AMMONIA in the last 168 hours.  CBC: Recent Labs  Lab 07/04/18 0746 07/09/18 0541  WBC 11.1* 9.8  HGB 8.4* 8.5*  HCT 27.2* 26.9*  MCV 94.4 94.7  PLT 288 204    Cardiac Enzymes: No results for input(s): CKTOTAL, CKMB, CKMBINDEX, TROPONINI  in the last 168 hours.  BNP (last 3 results) No results for input(s): BNP in the last 8760 hours.  ProBNP (last 3 results) No results for input(s): PROBNP in the last 8760 hours.  Radiological Exams: No results found.  Assessment/Plan Active Problems:   Acute on chronic respiratory failure with hypoxia (HCC)   Chronic atrial fibrillation   Chronic kidney disease, stage IV (severe) (HCC)   Pulmonary alveolar hemorrhage   Acute on chronic diastolic heart failure ([place])   1. Acute on chronic respiratory failure with hypoxia we will continue to wean as mentioned the goal is for 16 hours on pressure support today. 2. Chronic atrial fibrillation rate controlled 3. Chronic kidney disease stage IV stable we will continue to monitor 4. Pulmonary alveolar hemorrhage resolving 5. Acute on chronic diastolic heart failure monitor fluid status diuretics as necessary   I have personally seen and evaluated the patient, evaluated laboratory and imaging results, formulated the assessment and plan and placed orders. The Patient requires high complexity decision making for assessment and support.  Case was discussed on Rounds with the Respiratory Therapy Staff  Allyne Gee, MD [facility] Pulmonary Critical Care Medicine Sleep Medicine

## 2018-07-10 DIAGNOSIS — J9621 Acute and chronic respiratory failure with hypoxia: Secondary | ICD-10-CM | POA: Diagnosis not present

## 2018-07-10 DIAGNOSIS — N184 Chronic kidney disease, stage 4 (severe): Secondary | ICD-10-CM | POA: Diagnosis not present

## 2018-07-10 DIAGNOSIS — I5033 Acute on chronic diastolic (congestive) heart failure: Secondary | ICD-10-CM | POA: Diagnosis not present

## 2018-07-10 DIAGNOSIS — I482 Chronic atrial fibrillation, unspecified: Secondary | ICD-10-CM | POA: Diagnosis not present

## 2018-07-10 LAB — COMPREHENSIVE METABOLIC PANEL
ALT: 32 U/L (ref 0–44)
AST: 28 U/L (ref 15–41)
Albumin: 2.1 g/dL — ABNORMAL LOW (ref 3.5–5.0)
Alkaline Phosphatase: 103 U/L (ref 38–126)
Anion gap: 10 (ref 5–15)
BUN: 40 mg/dL — ABNORMAL HIGH (ref 8–23)
CO2: 26 mmol/L (ref 22–32)
Calcium: 10.6 mg/dL — ABNORMAL HIGH (ref 8.9–10.3)
Chloride: 101 mmol/L (ref 98–111)
Creatinine, Ser: 1.83 mg/dL — ABNORMAL HIGH (ref 0.44–1.00)
GFR calc Af Amer: 31 mL/min — ABNORMAL LOW (ref >60)
GFR calc non Af Amer: 26 mL/min — ABNORMAL LOW (ref >60)
Glucose, Bld: 156 mg/dL — ABNORMAL HIGH (ref 70–99)
Potassium: 3.1 mmol/L — ABNORMAL LOW (ref 3.5–5.1)
Sodium: 137 mmol/L (ref 135–145)
Total Bilirubin: 0.6 mg/dL (ref 0.3–1.2)
Total Protein: 4.9 g/dL — ABNORMAL LOW (ref 6.5–8.1)

## 2018-07-10 LAB — CBC
HCT: 27.3 % — ABNORMAL LOW (ref 36.0–46.0)
Hemoglobin: 8.6 g/dL — ABNORMAL LOW (ref 12.0–15.0)
MCH: 29.6 pg (ref 26.0–34.0)
MCHC: 31.5 g/dL (ref 30.0–36.0)
MCV: 93.8 fL (ref 80.0–100.0)
Platelets: 198 10*3/uL (ref 150–400)
RBC: 2.91 MIL/uL — ABNORMAL LOW (ref 3.87–5.11)
RDW: 17.3 % — ABNORMAL HIGH (ref 11.5–15.5)
WBC: 11.4 10*3/uL — ABNORMAL HIGH (ref 4.0–10.5)
nRBC: 0 % (ref 0.0–0.2)

## 2018-07-10 NOTE — Progress Notes (Signed)
Pulmonary Critical Care Medicine [place]   PULMONARY CRITICAL CARE SERVICE  PROGRESS NOTE  Date of Service: 07/10/2018  Loretta Bell  [id number]  DOB: 1940/04/05   DOA: 07/02/2018  Referring Physician: Merton Border, MD  HPI: Loretta Bell is a 78 y.o. female seen for follow up of Acute on Chronic Respiratory Failure.  Patient currently is on full support on pressure control mode at 1 of atrial fibrillation so therefore only has been held  Medications: Reviewed on Rounds  Physical Exam:  Vitals: Temperature 96.3 pulse 104 respiratory rate 10 blood pressure 105/51 saturations 100%  Ventilator Settings right now on pressure assist control FiO2 28% tidal volume 424 PEEP 5  . General: Comfortable at this time . Eyes: Grossly normal lids, irises & conjunctiva . ENT: grossly tongue is normal . Neck: no obvious mass . Cardiovascular: S1 S2 normal no gallop . Respiratory: No rhonchi or rales are noted at this time . Abdomen: soft . Skin: no rash seen on limited exam . Musculoskeletal: not rigid . Psychiatric:unable to assess . Neurologic: no seizure no involuntary movements         Lab Data:   Basic Metabolic Panel: Recent Labs  Lab 07/04/18 0746 07/05/18 0554 07/09/18 0541 07/10/18 1000  NA 139  --  133* 137  K 3.0* 4.3 2.8* 3.1*  CL 101  --  97* 101  CO2 26  --  22 26  GLUCOSE 113*  --  132* 156*  BUN 34*  --  34* 40*  CREATININE 1.67*  --  2.03* 1.83*  CALCIUM 9.0  --  10.0 10.6*  MG 1.7 2.0  --   --     ABG: No results for input(s): PHART, PCO2ART, PO2ART, HCO3, O2SAT in the last 168 hours.  Liver Function Tests: Recent Labs  Lab 07/09/18 0541 07/10/18 1000  AST 37 28  ALT 39 32  ALKPHOS 93 103  BILITOT 0.6 0.6  PROT 4.7* 4.9*  ALBUMIN 1.9* 2.1*   No results for input(s): LIPASE, AMYLASE in the last 168 hours. No results for input(s): AMMONIA in the last 168 hours.  CBC: Recent Labs  Lab 07/04/18 0746  07/09/18 0541 07/10/18 1000  WBC 11.1* 9.8 11.4*  HGB 8.4* 8.5* 8.6*  HCT 27.2* 26.9* 27.3*  MCV 94.4 94.7 93.8  PLT 288 204 198    Cardiac Enzymes: No results for input(s): CKTOTAL, CKMB, CKMBINDEX, TROPONINI in the last 168 hours.  BNP (last 3 results) No results for input(s): BNP in the last 8760 hours.  ProBNP (last 3 results) No results for input(s): PROBNP in the last 8760 hours.  Radiological Exams: No results found.  Assessment/Plan Active Problems:   Acute on chronic respiratory failure with hypoxia (HCC)   Chronic atrial fibrillation   Chronic kidney disease, stage IV (severe) (HCC)   Pulmonary alveolar hemorrhage   Acute on chronic diastolic heart failure ([place])   1. Acute on chronic respiratory failure hypoxia we will continue with full support on pressure control titrate oxygen continue pulmonary toilet reassess again tomorrow for weaning 2. Chronic atrial fibrillation had rapid ventricular response continue present management 3. Chronic kidney disease stage IV we will continue to monitor 4. Pulmonary alveolar hemorrhage at baseline we will follow 5. Acute on chronic diastolic heart failure right now is compensated   I have personally seen and evaluated the patient, evaluated laboratory and imaging results, formulated the assessment and plan and placed orders. The Patient requires high complexity decision  making for assessment and support.  Case was discussed on Rounds with the Respiratory Therapy Staff  Allyne Gee, MD [facility] Pulmonary Critical Care Medicine Sleep Medicine

## 2018-07-11 DIAGNOSIS — I5033 Acute on chronic diastolic (congestive) heart failure: Secondary | ICD-10-CM | POA: Diagnosis not present

## 2018-07-11 DIAGNOSIS — N184 Chronic kidney disease, stage 4 (severe): Secondary | ICD-10-CM | POA: Diagnosis not present

## 2018-07-11 DIAGNOSIS — I482 Chronic atrial fibrillation, unspecified: Secondary | ICD-10-CM | POA: Diagnosis not present

## 2018-07-11 DIAGNOSIS — J9621 Acute and chronic respiratory failure with hypoxia: Secondary | ICD-10-CM | POA: Diagnosis not present

## 2018-07-11 LAB — POTASSIUM: Potassium: 3.3 mmol/L — ABNORMAL LOW (ref 3.5–5.1)

## 2018-07-11 NOTE — Progress Notes (Addendum)
Pulmonary Critical Care Medicine [place]   PULMONARY CRITICAL CARE SERVICE  PROGRESS NOTE  Date of Service: 07/11/2018  Loretta Bell  VXB:[id number]  DOB: [birth year]   DOA: 07/02/2018  Referring Physician: Merton Border, MD  HPI: Loretta Bell is a 78 y.o. female seen for follow up of Acute on Chronic Respiratory Failure.  Patient is unable to wean ventilator today due to A. fib with RVR with heart rate in the 130s.  Remains on full support at this time.  Medications: Reviewed on Rounds  Physical Exam:  Vitals: Pulse 118 respirations 23 BP 144/89 O2 sat 100% 10 nine 7.0  Ventilator Settings ventilator mode AC PC rate of 14 inspiratory pressure 14 PEEP of 5 FiO2 of 28%  . General: Comfortable at this time . Eyes: Grossly normal lids, irises & conjunctiva . ENT: grossly tongue is normal . Neck: no obvious mass . Cardiovascular: S1 S2 normal no gallop . Respiratory: No rales or rhonchi noted . Abdomen: soft . Skin: no rash seen on limited exam . Musculoskeletal: not rigid . Psychiatric:unable to assess . Neurologic: no seizure no involuntary movements         Lab Data:   Basic Metabolic Panel: Recent Labs  Lab 07/05/18 0554 07/09/18 0541 07/10/18 1000 07/11/18 0748  NA  --  133* 137  --   K 4.3 2.8* 3.1* 3.3*  CL  --  97* 101  --   CO2  --  22 26  --   GLUCOSE  --  132* 156*  --   BUN  --  34* 40*  --   CREATININE  --  2.03* 1.83*  --   CALCIUM  --  10.0 10.6*  --   MG 2.0  --   --   --     ABG: No results for input(s): PHART, PCO2ART, PO2ART, HCO3, O2SAT in the last 168 hours.  Liver Function Tests: Recent Labs  Lab 07/09/18 0541 07/10/18 1000  AST 37 28  ALT 39 32  ALKPHOS 93 103  BILITOT 0.6 0.6  PROT 4.7* 4.9*  ALBUMIN 1.9* 2.1*   No results for input(s): LIPASE, AMYLASE in the last 168 hours. No results for input(s): AMMONIA in the last 168 hours.  CBC: Recent Labs  Lab 07/09/18 0541 07/10/18 1000  WBC  9.8 11.4*  HGB 8.5* 8.6*  HCT 26.9* 27.3*  MCV 94.7 93.8  PLT 204 198    Cardiac Enzymes: No results for input(s): CKTOTAL, CKMB, CKMBINDEX, TROPONINI in the last 168 hours.  BNP (last 3 results) No results for input(s): BNP in the last 8760 hours.  ProBNP (last 3 results) No results for input(s): PROBNP in the last 8760 hours.  Radiological Exams: No results found.  Assessment/Plan Active Problems:   Acute on chronic respiratory failure with hypoxia (HCC)   Chronic atrial fibrillation   Chronic kidney disease, stage IV (severe) (HCC)   Pulmonary alveolar hemorrhage   Acute on chronic diastolic heart failure ([place])   1. Acute on chronic respiratory failure hypoxia we will continue with full support on pressure control once her rate is under control we will continue to reassess for weaning. 2. Chronic atrial fibrillation had rapid ventricular response continue present management 3. Chronic kidney disease stage IV we will continue to monitor 4. Pulmonary alveolar hemorrhage at baseline we will follow 5. Acute on chronic diastolic heart failure right now is compensated   I have personally seen and evaluated the patient, evaluated laboratory  and imaging results, formulated the assessment and plan and placed orders. The Patient requires high complexity decision making for assessment and support.  Case was discussed on Rounds with the Respiratory Therapy Staff  Allyne Gee, MD [facility] Pulmonary Critical Care Medicine Sleep Medicine

## 2018-07-12 DIAGNOSIS — I5033 Acute on chronic diastolic (congestive) heart failure: Secondary | ICD-10-CM | POA: Diagnosis not present

## 2018-07-12 DIAGNOSIS — J9621 Acute and chronic respiratory failure with hypoxia: Secondary | ICD-10-CM | POA: Diagnosis not present

## 2018-07-12 DIAGNOSIS — N184 Chronic kidney disease, stage 4 (severe): Secondary | ICD-10-CM | POA: Diagnosis not present

## 2018-07-12 DIAGNOSIS — I482 Chronic atrial fibrillation, unspecified: Secondary | ICD-10-CM | POA: Diagnosis not present

## 2018-07-12 LAB — POTASSIUM: Potassium: 3.5 mmol/L (ref 3.5–5.1)

## 2018-07-12 NOTE — Progress Notes (Addendum)
Pulmonary Critical Care Medicine [place]   PULMONARY CRITICAL CARE SERVICE  PROGRESS NOTE  Date of Service: 07/12/2018  Loretta Bell  [id number]  DOB: [birth year]   DOA: 07/02/2018  Referring Physician: Merton Border, MD  HPI: Loretta Bell is a 78 y.o. female seen for follow up of Acute on Chronic Respiratory Failure.  Patient to our goal on aerosol trach collar 28% FiO2.  He would then be switched back to pressure support 12/5 with an FiO2 of 28% for 14-hour goal.  Patient is doing well at this time satting well with no distress or fever.  Medications: Reviewed on Rounds  Physical Exam:  Vitals: Pulse 82 respirations 16 BP 105/56 O2 sat on percent temp 94.6  Ventilator Settings pressure support 12/5 FiO2 of 28%  . General: Comfortable at this time . Eyes: Grossly normal lids, irises & conjunctiva . ENT: grossly tongue is normal . Neck: no obvious mass . Cardiovascular: S1 S2 normal no gallop . Respiratory: No rales or rhonchi noted . Abdomen: soft . Skin: no rash seen on limited exam . Musculoskeletal: not rigid . Psychiatric:unable to assess . Neurologic: no seizure no involuntary movements         Lab Data:   Basic Metabolic Panel: Recent Labs  Lab 07/09/18 0541 07/10/18 1000 07/11/18 0748 07/12/18 0942  NA 133* 137  --   --   K 2.8* 3.1* 3.3* 3.5  CL 97* 101  --   --   CO2 22 26  --   --   GLUCOSE 132* 156*  --   --   BUN 34* 40*  --   --   CREATININE 2.03* 1.83*  --   --   CALCIUM 10.0 10.6*  --   --     ABG: No results for input(s): PHART, PCO2ART, PO2ART, HCO3, O2SAT in the last 168 hours.  Liver Function Tests: Recent Labs  Lab 07/09/18 0541 07/10/18 1000  AST 37 28  ALT 39 32  ALKPHOS 93 103  BILITOT 0.6 0.6  PROT 4.7* 4.9*  ALBUMIN 1.9* 2.1*   No results for input(s): LIPASE, AMYLASE in the last 168 hours. No results for input(s): AMMONIA in the last 168 hours.  CBC: Recent Labs  Lab  07/09/18 0541 07/10/18 1000  WBC 9.8 11.4*  HGB 8.5* 8.6*  HCT 26.9* 27.3*  MCV 94.7 93.8  PLT 204 198    Cardiac Enzymes: No results for input(s): CKTOTAL, CKMB, CKMBINDEX, TROPONINI in the last 168 hours.  BNP (last 3 results) No results for input(s): BNP in the last 8760 hours.  ProBNP (last 3 results) No results for input(s): PROBNP in the last 8760 hours.  Radiological Exams: No results found.  Assessment/Plan Active Problems:   Acute on chronic respiratory failure with hypoxia (HCC)   Chronic atrial fibrillation   Chronic kidney disease, stage IV (severe) (HCC)   Pulmonary alveolar hemorrhage   Acute on chronic diastolic heart failure ([place])   1. Acute on chronic respiratory failure hypoxia continue to wean per protocol.  Patient was able to tolerate 2 hours aerosol trach collar is now working towards 14 hours on pressure support.  Continue pulmonary toilet and supportive measures. 2. Chronic atrial fibrillation had rapid ventricular response continue present management 3. Chronic kidney disease stage IV we will continue to monitor 4. Pulmonary alveolar hemorrhage at baseline we will follow 5. Acute on chronic diastolic heart failure right now is compensated   I have personally seen and  evaluated the patient, evaluated laboratory and imaging results, formulated the assessment and plan and placed orders. The Patient requires high complexity decision making for assessment and support.  Case was discussed on Rounds with the Respiratory Therapy Staff  Allyne Gee, MD [facility] Pulmonary Critical Care Medicine Sleep Medicine

## 2018-07-13 DIAGNOSIS — N184 Chronic kidney disease, stage 4 (severe): Secondary | ICD-10-CM | POA: Diagnosis not present

## 2018-07-13 DIAGNOSIS — I482 Chronic atrial fibrillation, unspecified: Secondary | ICD-10-CM | POA: Diagnosis not present

## 2018-07-13 DIAGNOSIS — I5033 Acute on chronic diastolic (congestive) heart failure: Secondary | ICD-10-CM | POA: Diagnosis not present

## 2018-07-13 DIAGNOSIS — J9621 Acute and chronic respiratory failure with hypoxia: Secondary | ICD-10-CM | POA: Diagnosis not present

## 2018-07-13 LAB — CORTISOL: Cortisol, Plasma: 14.4 ug/dL

## 2018-07-13 LAB — TSH: TSH: 2.513 u[IU]/mL (ref 0.350–4.500)

## 2018-07-13 LAB — T4, FREE: Free T4: 0.73 ng/dL — ABNORMAL LOW (ref 0.82–1.77)

## 2018-07-13 NOTE — Progress Notes (Addendum)
Pulmonary Critical Care Medicine [place]   PULMONARY CRITICAL CARE SERVICE  PROGRESS NOTE  Date of Service: 07/13/2018  Loretta Bell  [id number]  DOB: 1940/07/15   DOA: 07/02/2018  Referring Physician: Merton Border, MD  HPI: Loretta Bell is a 78 y.o. female seen for follow up of Acute on Chronic Respiratory Failure.  Patient mains on aerosol trach collar with a 4-hour goal at 28% FiO2.  Currently doing well with no distress noted.  Medications: Reviewed on Rounds  Physical Exam:  Vitals: Pulse 110 respirations 31 BP 115/63 O2 sat 100% temp 96.4  Ventilator Settings ATC 28%  . General: Comfortable at this time . Eyes: Grossly normal lids, irises & conjunctiva . ENT: grossly tongue is normal . Neck: no obvious mass . Cardiovascular: S1 S2 normal no gallop . Respiratory: No rales or rhonchi noted . Abdomen: soft . Skin: no rash seen on limited exam . Musculoskeletal: not rigid . Psychiatric:unable to assess . Neurologic: no seizure no involuntary movements         Lab Data:   Basic Metabolic Panel: Recent Labs  Lab 07/09/18 0541 07/10/18 1000 07/11/18 0748 07/12/18 0942  NA 133* 137  --   --   K 2.8* 3.1* 3.3* 3.5  CL 97* 101  --   --   CO2 22 26  --   --   GLUCOSE 132* 156*  --   --   BUN 34* 40*  --   --   CREATININE 2.03* 1.83*  --   --   CALCIUM 10.0 10.6*  --   --     ABG: No results for input(s): PHART, PCO2ART, PO2ART, HCO3, O2SAT in the last 168 hours.  Liver Function Tests: Recent Labs  Lab 07/09/18 0541 07/10/18 1000  AST 37 28  ALT 39 32  ALKPHOS 93 103  BILITOT 0.6 0.6  PROT 4.7* 4.9*  ALBUMIN 1.9* 2.1*   No results for input(s): LIPASE, AMYLASE in the last 168 hours. No results for input(s): AMMONIA in the last 168 hours.  CBC: Recent Labs  Lab 07/09/18 0541 07/10/18 1000  WBC 9.8 11.4*  HGB 8.5* 8.6*  HCT 26.9* 27.3*  MCV 94.7 93.8  PLT 204 198    Cardiac Enzymes: No results for  input(s): CKTOTAL, CKMB, CKMBINDEX, TROPONINI in the last 168 hours.  BNP (last 3 results) No results for input(s): BNP in the last 8760 hours.  ProBNP (last 3 results) No results for input(s): PROBNP in the last 8760 hours.  Radiological Exams: No results found.  Assessment/Plan Active Problems:   Acute on chronic respiratory failure with hypoxia (HCC)   Chronic atrial fibrillation   Chronic kidney disease, stage IV (severe) (HCC)   Pulmonary alveolar hemorrhage   Acute on chronic diastolic heart failure ([place])   1. Acute on chronic respiratory failure hypoxia continue to wean per protocol.    Patient has a goal today on 4 hours on aerosol trach collar 28%. Continue pulmonary toilet and supportive measures. 2. Chronic atrial fibrillation had rapid ventricular response continue present management 3. Chronic kidney disease stage IV we will continue to monitor 4. Pulmonary alveolar hemorrhage at baseline we will follow 5. Acute on chronic diastolic heart failure right now is compensated   I have personally seen and evaluated the patient, evaluated laboratory and imaging results, formulated the assessment and plan and placed orders. The Patient requires high complexity decision making for assessment and support.  Case was discussed on Rounds  with the Respiratory Therapy Staff  Allyne Gee, MD [facility] Pulmonary Critical Care Medicine Sleep Medicine

## 2018-07-14 DIAGNOSIS — J9621 Acute and chronic respiratory failure with hypoxia: Secondary | ICD-10-CM | POA: Diagnosis not present

## 2018-07-14 DIAGNOSIS — N184 Chronic kidney disease, stage 4 (severe): Secondary | ICD-10-CM | POA: Diagnosis not present

## 2018-07-14 DIAGNOSIS — I5033 Acute on chronic diastolic (congestive) heart failure: Secondary | ICD-10-CM | POA: Diagnosis not present

## 2018-07-14 DIAGNOSIS — I482 Chronic atrial fibrillation, unspecified: Secondary | ICD-10-CM | POA: Diagnosis not present

## 2018-07-14 NOTE — Progress Notes (Addendum)
Pulmonary Critical Care Medicine [place]   PULMONARY CRITICAL CARE SERVICE  PROGRESS NOTE  Date of Service: 07/14/2018  Jacqeline Broers  [id number]  DOB: 1940-05-07   DOA: 07/02/2018  Referring Physician: Merton Border, MD  HPI: Loretta Bell is a 78 y.o. female seen for follow up of Acute on Chronic Respiratory Failure.  Patient is on aerosol trach collar 28% FiO2 for an 8-hour goal today.  Currently satting well with no fever or distress noted.  Medications: Reviewed on Rounds  Physical Exam:  Vitals: Pulse 109 respirations 20 BP 133/84 O2 sat 99% temp  Ventilator Settings ATC 28%  . General: Comfortable at this time . Eyes: Grossly normal lids, irises & conjunctiva . ENT: grossly tongue is normal . Neck: no obvious mass . Cardiovascular: S1 S2 normal no gallop . Respiratory: No rales or rhonchi noted . Abdomen: soft . Skin: no rash seen on limited exam . Musculoskeletal: not rigid . Psychiatric:unable to assess . Neurologic: no seizure no involuntary movements         Lab Data:   Basic Metabolic Panel: Recent Labs  Lab 07/09/18 0541 07/10/18 1000 07/11/18 0748 07/12/18 0942  NA 133* 137  --   --   K 2.8* 3.1* 3.3* 3.5  CL 97* 101  --   --   CO2 22 26  --   --   GLUCOSE 132* 156*  --   --   BUN 34* 40*  --   --   CREATININE 2.03* 1.83*  --   --   CALCIUM 10.0 10.6*  --   --     ABG: No results for input(s): PHART, PCO2ART, PO2ART, HCO3, O2SAT in the last 168 hours.  Liver Function Tests: Recent Labs  Lab 07/09/18 0541 07/10/18 1000  AST 37 28  ALT 39 32  ALKPHOS 93 103  BILITOT 0.6 0.6  PROT 4.7* 4.9*  ALBUMIN 1.9* 2.1*   No results for input(s): LIPASE, AMYLASE in the last 168 hours. No results for input(s): AMMONIA in the last 168 hours.  CBC: Recent Labs  Lab 07/09/18 0541 07/10/18 1000  WBC 9.8 11.4*  HGB 8.5* 8.6*  HCT 26.9* 27.3*  MCV 94.7 93.8  PLT 204 198    Cardiac Enzymes: No results  for input(s): CKTOTAL, CKMB, CKMBINDEX, TROPONINI in the last 168 hours.  BNP (last 3 results) No results for input(s): BNP in the last 8760 hours.  ProBNP (last 3 results) No results for input(s): PROBNP in the last 8760 hours.  Radiological Exams: No results found.  Assessment/Plan Active Problems:   Acute on chronic respiratory failure with hypoxia (HCC)   Chronic atrial fibrillation   Chronic kidney disease, stage IV (severe) (HCC)   Pulmonary alveolar hemorrhage   Acute on chronic diastolic heart failure ([place])   1. Acute on chronic respiratory failure hypoxiacontinue to wean per protocol.    She is working towards a current goal of 8 hours on aerosol trach collar 28%. Continue pulmonary toilet and supportive measures. 2. Chronic atrial fibrillation had rapid ventricular response continue present management 3. Chronic kidney disease stage IV we will continue to monitor 4. Pulmonary alveolar hemorrhage at baseline we will follow 5. Acute on chronic diastolic heart failure right now is compensated   I have personally seen and evaluated the patient, evaluated laboratory and imaging results, formulated the assessment and plan and placed orders. The Patient requires high complexity decision making for assessment and support.  Case was discussed  on Rounds with the Respiratory Therapy Staff  Allyne Gee, MD [facility] Pulmonary Critical Care Medicine Sleep Medicine

## 2018-07-15 DIAGNOSIS — I5033 Acute on chronic diastolic (congestive) heart failure: Secondary | ICD-10-CM | POA: Diagnosis not present

## 2018-07-15 DIAGNOSIS — J9621 Acute and chronic respiratory failure with hypoxia: Secondary | ICD-10-CM | POA: Diagnosis not present

## 2018-07-15 DIAGNOSIS — I482 Chronic atrial fibrillation, unspecified: Secondary | ICD-10-CM | POA: Diagnosis not present

## 2018-07-15 DIAGNOSIS — N184 Chronic kidney disease, stage 4 (severe): Secondary | ICD-10-CM | POA: Diagnosis not present

## 2018-07-15 NOTE — Progress Notes (Addendum)
Pulmonary Critical Care Medicine [place]   PULMONARY CRITICAL CARE SERVICE  PROGRESS NOTE  Date of Service: 07/15/2018  Loretta Bell  [id number]  DOB: 1940/07/16   DOA: 07/02/2018  Referring Physician: Merton Border, MD  HPI: Loretta Bell is a 78 y.o. female seen for follow up of Acute on Chronic Respiratory Failure.  Patient is a 12-hour goal today on aerosol trach collar 28% FiO2 currently satting well with no fever or distress.  Medications: Reviewed on Rounds  Physical Exam:  Vitals: Pulse 99 respirations 21 BP 163/89 O2 sat 97% temp 97.8  Ventilator Settings ATC 20%  . General: Comfortable at this time . Eyes: Grossly normal lids, irises & conjunctiva . ENT: grossly tongue is normal . Neck: no obvious mass . Cardiovascular: S1 S2 normal no gallop . Respiratory: No rales or rhonchi noted . Abdomen: soft . Skin: no rash seen on limited exam . Musculoskeletal: not rigid . Psychiatric:unable to assess . Neurologic: no seizure no involuntary movements         Lab Data:   Basic Metabolic Panel: Recent Labs  Lab 07/09/18 0541 07/10/18 1000 07/11/18 0748 07/12/18 0942  NA 133* 137  --   --   K 2.8* 3.1* 3.3* 3.5  CL 97* 101  --   --   CO2 22 26  --   --   GLUCOSE 132* 156*  --   --   BUN 34* 40*  --   --   CREATININE 2.03* 1.83*  --   --   CALCIUM 10.0 10.6*  --   --     ABG: No results for input(s): PHART, PCO2ART, PO2ART, HCO3, O2SAT in the last 168 hours.  Liver Function Tests: Recent Labs  Lab 07/09/18 0541 07/10/18 1000  AST 37 28  ALT 39 32  ALKPHOS 93 103  BILITOT 0.6 0.6  PROT 4.7* 4.9*  ALBUMIN 1.9* 2.1*   No results for input(s): LIPASE, AMYLASE in the last 168 hours. No results for input(s): AMMONIA in the last 168 hours.  CBC: Recent Labs  Lab 07/09/18 0541 07/10/18 1000  WBC 9.8 11.4*  HGB 8.5* 8.6*  HCT 26.9* 27.3*  MCV 94.7 93.8  PLT 204 198    Cardiac Enzymes: No results for  input(s): CKTOTAL, CKMB, CKMBINDEX, TROPONINI in the last 168 hours.  BNP (last 3 results) No results for input(s): BNP in the last 8760 hours.  ProBNP (last 3 results) No results for input(s): PROBNP in the last 8760 hours.  Radiological Exams: No results found.  Assessment/Plan Active Problems:   Acute on chronic respiratory failure with hypoxia (HCC)   Chronic atrial fibrillation   Chronic kidney disease, stage IV (severe) (HCC)   Pulmonary alveolar hemorrhage   Acute on chronic diastolic heart failure ([place])   1. Acute on chronic respiratory failure with hypoxia continue to wean per protocol working towards a 12-hour goal and home aide aerosol trach collar 28% FiO2.  Continue supportive measures. 2. Chronic atrial fibrillation with RVR continue present management 3. Chronic kidney disease stage IV continue to monitor 4. Pulmonary alveolar hemorrhage [place] continue to follow 5. Acute on chronic diastolic heart failure compensated at this time   I have personally seen and evaluated the patient, evaluated laboratory and imaging results, formulated the assessment and plan and placed orders. The Patient requires high complexity decision making for assessment and support.  Case was discussed on Rounds with the Respiratory Therapy Staff  Allyne Gee, MD  [facility] Pulmonary Critical Care Medicine Sleep Medicine

## 2018-07-16 DIAGNOSIS — N184 Chronic kidney disease, stage 4 (severe): Secondary | ICD-10-CM | POA: Diagnosis not present

## 2018-07-16 DIAGNOSIS — I482 Chronic atrial fibrillation, unspecified: Secondary | ICD-10-CM | POA: Diagnosis not present

## 2018-07-16 DIAGNOSIS — J9621 Acute and chronic respiratory failure with hypoxia: Secondary | ICD-10-CM | POA: Diagnosis not present

## 2018-07-16 DIAGNOSIS — I5033 Acute on chronic diastolic (congestive) heart failure: Secondary | ICD-10-CM | POA: Diagnosis not present

## 2018-07-16 NOTE — Progress Notes (Addendum)
Pulmonary Critical Care Medicine [place]   PULMONARY CRITICAL CARE SERVICE  PROGRESS NOTE  Date of Service: 07/16/2018  Loretta Bell  FXT:[id number]  DOB: Feb 11, 1940   DOA: 07/02/2018  Referring Physician: Merton Border, MD  HPI: Loretta Bell is a 78 y.o. female seen for follow up of Acute on Chronic Respiratory Failure.  Patient remains on aerosol trach collar 28% FiO2 with a 16-hour goal today.  Satting well with no distress.  Medications: Reviewed on Rounds  Physical Exam:  Vitals: Pulse is 80 respirations 22 BP 142/88 O2 sat 92% temp 96.7  Ventilator Settings ATC 28%  . General: Comfortable at this time . Eyes: Grossly normal lids, irises & conjunctiva . ENT: grossly tongue is normal . Neck: no obvious mass . Cardiovascular: S1 S2 normal no gallop . Respiratory: No rales or rhonchi noted . Abdomen: soft . Skin: no rash seen on limited exam . Musculoskeletal: not rigid . Psychiatric:unable to assess . Neurologic: no seizure no involuntary movements         Lab Data:   Basic Metabolic Panel: Recent Labs  Lab 07/10/18 1000 07/11/18 0748 07/12/18 0942  NA 137  --   --   K 3.1* 3.3* 3.5  CL 101  --   --   CO2 26  --   --   GLUCOSE 156*  --   --   BUN 40*  --   --   CREATININE 1.83*  --   --   CALCIUM 10.6*  --   --     ABG: No results for input(s): PHART, PCO2ART, PO2ART, HCO3, O2SAT in the last 168 hours.  Liver Function Tests: Recent Labs  Lab 07/10/18 1000  AST 28  ALT 32  ALKPHOS 103  BILITOT 0.6  PROT 4.9*  ALBUMIN 2.1*   No results for input(s): LIPASE, AMYLASE in the last 168 hours. No results for input(s): AMMONIA in the last 168 hours.  CBC: Recent Labs  Lab 07/10/18 1000  WBC 11.4*  HGB 8.6*  HCT 27.3*  MCV 93.8  PLT 198    Cardiac Enzymes: No results for input(s): CKTOTAL, CKMB, CKMBINDEX, TROPONINI in the last 168 hours.  BNP (last 3 results) No results for input(s): BNP in the last  8760 hours.  ProBNP (last 3 results) No results for input(s): PROBNP in the last 8760 hours.  Radiological Exams: No results found.  Assessment/Plan Active Problems:   Acute on chronic respiratory failure with hypoxia (HCC)   Chronic atrial fibrillation   Chronic kidney disease, stage IV (severe) (HCC)   Pulmonary alveolar hemorrhage   Acute on chronic diastolic heart failure ([place])   1. Acute on chronic respiratory failure with hypoxia continue to wean per protocol working towards a 16-hour goal and home aide aerosol trach collar 28% FiO2.  Continue supportive measures. 2. Chronic atrial fibrillation with RVR continue present management 3. Chronic kidney disease stage IV continue to monitor 4. Pulmonary alveolar hemorrhage Exline continue to follow 5. Acute on chronic diastolic heart failure compensated at this time   I have personally seen and evaluated the patient, evaluated laboratory and imaging results, formulated the assessment and plan and placed orders. The Patient requires high complexity decision making for assessment and support.  Case was discussed on Rounds with the Respiratory Therapy Staff  Allyne Gee, MD [facility] Pulmonary Critical Care Medicine Sleep Medicine

## 2018-07-17 DIAGNOSIS — I5033 Acute on chronic diastolic (congestive) heart failure: Secondary | ICD-10-CM | POA: Diagnosis not present

## 2018-07-17 DIAGNOSIS — N184 Chronic kidney disease, stage 4 (severe): Secondary | ICD-10-CM | POA: Diagnosis not present

## 2018-07-17 DIAGNOSIS — I482 Chronic atrial fibrillation, unspecified: Secondary | ICD-10-CM | POA: Diagnosis not present

## 2018-07-17 DIAGNOSIS — J9621 Acute and chronic respiratory failure with hypoxia: Secondary | ICD-10-CM | POA: Diagnosis not present

## 2018-07-17 LAB — BASIC METABOLIC PANEL
Anion gap: 15 (ref 5–15)
BUN: 109 mg/dL — ABNORMAL HIGH (ref 8–23)
CO2: 25 mmol/L (ref 22–32)
Calcium: 10.9 mg/dL — ABNORMAL HIGH (ref 8.9–10.3)
Chloride: 96 mmol/L — ABNORMAL LOW (ref 98–111)
Creatinine, Ser: 2.15 mg/dL — ABNORMAL HIGH (ref 0.44–1.00)
GFR calc Af Amer: 25 mL/min — ABNORMAL LOW (ref >60)
GFR calc non Af Amer: 22 mL/min — ABNORMAL LOW (ref >60)
Glucose, Bld: 158 mg/dL — ABNORMAL HIGH (ref 70–99)
Potassium: 3.5 mmol/L (ref 3.5–5.1)
Sodium: 136 mmol/L (ref 135–145)

## 2018-07-17 LAB — CBC
HCT: 26.3 % — ABNORMAL LOW (ref 36.0–46.0)
Hemoglobin: 8.4 g/dL — ABNORMAL LOW (ref 12.0–15.0)
MCH: 29.9 pg (ref 26.0–34.0)
MCHC: 31.9 g/dL (ref 30.0–36.0)
MCV: 93.6 fL (ref 80.0–100.0)
Platelets: 161 10*3/uL (ref 150–400)
RBC: 2.81 MIL/uL — ABNORMAL LOW (ref 3.87–5.11)
RDW: 17.2 % — ABNORMAL HIGH (ref 11.5–15.5)
WBC: 11.9 10*3/uL — ABNORMAL HIGH (ref 4.0–10.5)
nRBC: 0 % (ref 0.0–0.2)

## 2018-07-17 NOTE — Progress Notes (Addendum)
Pulmonary Critical Care Medicine [place]   PULMONARY CRITICAL CARE SERVICE  PROGRESS NOTE  Date of Service: 07/17/2018  Loretta Bell  [id number]:[id number]  DOB: [birth year]   DOA: 07/02/2018  Referring Physician: Merton Border, MD  HPI: Loretta Bell is a 78 y.o. female seen for follow up of Acute on Chronic Respiratory Failure.  Patient remains on aerosol trach collar with a goal of 20 hours at this time.  Satting well with no distress.  Medications: Reviewed on Rounds  Physical Exam:  Vitals: Pulse 113 respirations 21 BP 133/62 O2 sat 100% temp 97.9  Ventilator Settings ATC 28%  . General: Comfortable at this time . Eyes: Grossly normal lids, irises & conjunctiva . ENT: grossly tongue is normal . Neck: no obvious mass . Cardiovascular: S1 S2 normal no gallop . Respiratory: No rales or rhonchi noted . Abdomen: soft . Skin: no rash seen on limited exam . Musculoskeletal: not rigid . Psychiatric:unable to assess . Neurologic: no seizure no involuntary movements         Lab Data:   Basic Metabolic Panel: Recent Labs  Lab 07/11/18 0748 07/12/18 0942 07/17/18 0744  NA  --   --  136  K 3.3* 3.5 3.5  CL  --   --  96*  CO2  --   --  25  GLUCOSE  --   --  158*  BUN  --   --  109*  CREATININE  --   --  2.15*  CALCIUM  --   --  10.9*    ABG: No results for input(s): PHART, PCO2ART, PO2ART, HCO3, O2SAT in the last 168 hours.  Liver Function Tests: No results for input(s): AST, ALT, ALKPHOS, BILITOT, PROT, ALBUMIN in the last 168 hours. No results for input(s): LIPASE, AMYLASE in the last 168 hours. No results for input(s): AMMONIA in the last 168 hours.  CBC: Recent Labs  Lab 07/17/18 0744  WBC 11.9*  HGB 8.4*  HCT 26.3*  MCV 93.6  PLT 161    Cardiac Enzymes: No results for input(s): CKTOTAL, CKMB, CKMBINDEX, TROPONINI in the last 168 hours.  BNP (last 3 results) No results for input(s): BNP in the last 8760 hours.  ProBNP  (last 3 results) No results for input(s): PROBNP in the last 8760 hours.  Radiological Exams: No results found.  Assessment/Plan Active Problems:   Acute on chronic respiratory failure with hypoxia (HCC)   Chronic atrial fibrillation   Chronic kidney disease, stage IV (severe) (HCC)   Pulmonary alveolar hemorrhage   Acute on chronic diastolic heart failure ([place])   1. Acute on chronic respiratory failure with hypoxia continue to wean per protocol working towards a 20-hour goal and home aide aerosol trach collar 28% FiO2. Continue supportive measures. 2. Chronic atrial fibrillation with RVR continue present management 3. Chronic kidney disease stage IV continue to monitor 4. Pulmonary alveolar hemorrhage [place] continue to follow 5. Acute on chronic diastolic heart failure compensated at this time   I have personally seen and evaluated the patient, evaluated laboratory and imaging results, formulated the assessment and plan and placed orders. The Patient requires high complexity decision making for assessment and support.  Case was discussed on Rounds with the Respiratory Therapy Staff  Allyne Gee, MD [facility] Pulmonary Critical Care Medicine Sleep Medicine

## 2018-07-18 DIAGNOSIS — I5033 Acute on chronic diastolic (congestive) heart failure: Secondary | ICD-10-CM | POA: Diagnosis not present

## 2018-07-18 DIAGNOSIS — I482 Chronic atrial fibrillation, unspecified: Secondary | ICD-10-CM | POA: Diagnosis not present

## 2018-07-18 DIAGNOSIS — N184 Chronic kidney disease, stage 4 (severe): Secondary | ICD-10-CM | POA: Diagnosis not present

## 2018-07-18 DIAGNOSIS — J9621 Acute and chronic respiratory failure with hypoxia: Secondary | ICD-10-CM | POA: Diagnosis not present

## 2018-07-18 NOTE — Progress Notes (Signed)
Pulmonary Critical Care Medicine [place]   PULMONARY CRITICAL CARE SERVICE  PROGRESS NOTE  Date of Service: 07/18/2018  Loretta Bell  [id number]:[id number]  DOB: [birth year]   DOA: 07/02/2018  Referring Physician: Merton Border, MD  HPI: Loretta Bell is a 78 y.o. female seen for follow up of Acute on Chronic Respiratory Failure.  Patient currently is on T collar has been on 28% FiO2  Medications: Reviewed on Rounds  Physical Exam:  Vitals: Temperature 97.0 pulse 89 respiratory rate 17 blood pressure 155/89 saturations 99%  Ventilator Settings off the ventilator on T collar  . General: Comfortable at this time . Eyes: Grossly normal lids, irises & conjunctiva . ENT: grossly tongue is normal . Neck: no obvious mass . Cardiovascular: S1 S2 normal no gallop . Respiratory: No rhonchi or rales are noted at this time . Abdomen: soft . Skin: no rash seen on limited exam . Musculoskeletal: not rigid . Psychiatric:unable to assess . Neurologic: no seizure no involuntary movements         Lab Data:   Basic Metabolic Panel: Recent Labs  Lab 07/12/18 0942 07/17/18 0744  NA  --  136  K 3.5 3.5  CL  --  96*  CO2  --  25  GLUCOSE  --  158*  BUN  --  109*  CREATININE  --  2.15*  CALCIUM  --  10.9*    ABG: No results for input(s): PHART, PCO2ART, PO2ART, HCO3, O2SAT in the last 168 hours.  Liver Function Tests: No results for input(s): AST, ALT, ALKPHOS, BILITOT, PROT, ALBUMIN in the last 168 hours. No results for input(s): LIPASE, AMYLASE in the last 168 hours. No results for input(s): AMMONIA in the last 168 hours.  CBC: Recent Labs  Lab 07/17/18 0744  WBC 11.9*  HGB 8.4*  HCT 26.3*  MCV 93.6  PLT 161    Cardiac Enzymes: No results for input(s): CKTOTAL, CKMB, CKMBINDEX, TROPONINI in the last 168 hours.  BNP (last 3 results) No results for input(s): BNP in the last 8760 hours.  ProBNP (last 3 results) No results for input(s):  PROBNP in the last 8760 hours.  Radiological Exams: No results found.  Assessment/Plan Active Problems:   Acute on chronic respiratory failure with hypoxia (HCC)   Chronic atrial fibrillation   Chronic kidney disease, stage IV (severe) (HCC)   Pulmonary alveolar hemorrhage   Acute on chronic diastolic heart failure ([place])   1. Acute on chronic respiratory failure hypoxia continue T collar trials advance as tolerated 2. Chronic atrial fibrillation rate controlled 3. Disease stage IV follow labs 4. Pulmonary hemorrhage stabilized 5. Acute on chronic diastolic heart failure diuretics as necessary   I have personally seen and evaluated the patient, evaluated laboratory and imaging results, formulated the assessment and plan and placed orders. The Patient requires high complexity decision making for assessment and support.  Case was discussed on Rounds with the Respiratory Therapy Staff  Allyne Gee, MD [facility] Pulmonary Critical Care Medicine Sleep Medicine

## 2018-07-19 DIAGNOSIS — I482 Chronic atrial fibrillation, unspecified: Secondary | ICD-10-CM | POA: Diagnosis not present

## 2018-07-19 DIAGNOSIS — I5033 Acute on chronic diastolic (congestive) heart failure: Secondary | ICD-10-CM | POA: Diagnosis not present

## 2018-07-19 DIAGNOSIS — J9621 Acute and chronic respiratory failure with hypoxia: Secondary | ICD-10-CM | POA: Diagnosis not present

## 2018-07-19 DIAGNOSIS — N184 Chronic kidney disease, stage 4 (severe): Secondary | ICD-10-CM | POA: Diagnosis not present

## 2018-07-19 NOTE — Progress Notes (Signed)
Pulmonary Critical Care Medicine [place]   PULMONARY CRITICAL CARE SERVICE  PROGRESS NOTE  Date of Service: 07/19/2018  Loretta Bell  LJQ:[id number]  DOB: 12/27/1940   DOA: 07/02/2018  Referring Physician: Merton Border, MD  HPI: Loretta Bell is a 78 y.o. female seen for follow up of Acute on Chronic Respiratory Failure.  Patient is currently on T collar has been on 28% FiO2 more than 48 hours off the vent  Medications: Reviewed on Rounds  Physical Exam:  Vitals: Temperature 97.1 pulse 102 respiratory 21 blood pressure 151/72 saturations 97%  Ventilator Settings off the ventilator on T collar at this time  . General: Comfortable at this time . Eyes: Grossly normal lids, irises & conjunctiva . ENT: grossly tongue is normal . Neck: no obvious mass . Cardiovascular: S1 S2 normal no gallop . Respiratory: No rhonchi or rales are noted . Abdomen: soft . Skin: no rash seen on limited exam . Musculoskeletal: not rigid . Psychiatric:unable to assess . Neurologic: no seizure no involuntary movements         Lab Data:   Basic Metabolic Panel: Recent Labs  Lab 07/17/18 0744  NA 136  K 3.5  CL 96*  CO2 25  GLUCOSE 158*  BUN 109*  CREATININE 2.15*  CALCIUM 10.9*    ABG: No results for input(s): PHART, PCO2ART, PO2ART, HCO3, O2SAT in the last 168 hours.  Liver Function Tests: No results for input(s): AST, ALT, ALKPHOS, BILITOT, PROT, ALBUMIN in the last 168 hours. No results for input(s): LIPASE, AMYLASE in the last 168 hours. No results for input(s): AMMONIA in the last 168 hours.  CBC: Recent Labs  Lab 07/17/18 0744  WBC 11.9*  HGB 8.4*  HCT 26.3*  MCV 93.6  PLT 161    Cardiac Enzymes: No results for input(s): CKTOTAL, CKMB, CKMBINDEX, TROPONINI in the last 168 hours.  BNP (last 3 results) No results for input(s): BNP in the last 8760 hours.  ProBNP (last 3 results) No results for input(s): PROBNP in the last 8760  hours.  Radiological Exams: No results found.  Assessment/Plan Active Problems:   Acute on chronic respiratory failure with hypoxia (HCC)   Chronic atrial fibrillation   Chronic kidney disease, stage IV (severe) (HCC)   Pulmonary alveolar hemorrhage   Acute on chronic diastolic heart failure ([place])   1. Acute on chronic respiratory failure with hypoxia we will continue to advance weaning on T collar 2. Chronic atrial fibrillation rate controlled 3. Chronic kidney disease stage IV we will continue to monitor 4. Pulmonary hemorrhage resolved 5. Acute on chronic diastolic heart failure at baseline we will continue present management   I have personally seen and evaluated the patient, evaluated laboratory and imaging results, formulated the assessment and plan and placed orders. The Patient requires high complexity decision making for assessment and support.  Case was discussed on Rounds with the Respiratory Therapy Staff  Allyne Gee, MD [facility] Pulmonary Critical Care Medicine Sleep Medicine

## 2018-07-20 ENCOUNTER — Other Ambulatory Visit (HOSPITAL_COMMUNITY): Payer: Medicare Other

## 2018-07-20 DIAGNOSIS — I5033 Acute on chronic diastolic (congestive) heart failure: Secondary | ICD-10-CM | POA: Diagnosis not present

## 2018-07-20 DIAGNOSIS — J9621 Acute and chronic respiratory failure with hypoxia: Secondary | ICD-10-CM | POA: Diagnosis not present

## 2018-07-20 DIAGNOSIS — N184 Chronic kidney disease, stage 4 (severe): Secondary | ICD-10-CM | POA: Diagnosis not present

## 2018-07-20 DIAGNOSIS — I482 Chronic atrial fibrillation, unspecified: Secondary | ICD-10-CM | POA: Diagnosis not present

## 2018-07-20 LAB — BASIC METABOLIC PANEL
Anion gap: 12 (ref 5–15)
BUN: 117 mg/dL — ABNORMAL HIGH (ref 8–23)
CO2: 27 mmol/L (ref 22–32)
Calcium: 10 mg/dL (ref 8.9–10.3)
Chloride: 98 mmol/L (ref 98–111)
Creatinine, Ser: 2.33 mg/dL — ABNORMAL HIGH (ref 0.44–1.00)
GFR calc Af Amer: 23 mL/min — ABNORMAL LOW (ref >60)
GFR calc non Af Amer: 20 mL/min — ABNORMAL LOW (ref >60)
Glucose, Bld: 183 mg/dL — ABNORMAL HIGH (ref 70–99)
Potassium: 2.9 mmol/L — ABNORMAL LOW (ref 3.5–5.1)
Sodium: 137 mmol/L (ref 135–145)

## 2018-07-20 LAB — CBC
HCT: 27.1 % — ABNORMAL LOW (ref 36.0–46.0)
Hemoglobin: 8.6 g/dL — ABNORMAL LOW (ref 12.0–15.0)
MCH: 29.6 pg (ref 26.0–34.0)
MCHC: 31.7 g/dL (ref 30.0–36.0)
MCV: 93.1 fL (ref 80.0–100.0)
Platelets: 183 10*3/uL (ref 150–400)
RBC: 2.91 MIL/uL — ABNORMAL LOW (ref 3.87–5.11)
RDW: 17.3 % — ABNORMAL HIGH (ref 11.5–15.5)
WBC: 11 10*3/uL — ABNORMAL HIGH (ref 4.0–10.5)
nRBC: 0 % (ref 0.0–0.2)

## 2018-07-20 NOTE — Consult Note (Signed)
[facility] CONSULT NOTE    Date: 07/20/2018                  Patient Name:  Loretta Bell  MRN: [id number]  DOB: 1940/05/12  Age / Sex: 78 y.o., female         PCP: Gilford Silvius, MD                 Service Requesting Consult: Hospitalist                 Reason for Consult: Acute renal failure/CKD IV            History of Present Illness: Patient is a 78 y.o. female with a PMHx of atrial fibrillation, chronic kidney disease stage IV, diabetes mellitus type 2, hypertension,, recent acute on chronic respiratory failure, obstructive sleep apnea, pulmonary hypertension, anemia of chronic kidney disease, secondary hyperparathyroidism, sick sinus syndrome, history of prior aortic valve endocarditis who was admitted to [facility] on 07/02/2018 for ongoing treatment of acute on chronic respiratory failure.  We are asked to see her now for worsening acute renal failure.  She has known chronic kidney disease stage IV.  At her prior hospitalization patient did require short-term dialysis.  She appears to be developing worsening acidemia.  Upon initial evaluation BUN was 34 with a creatinine 1.67.  BUN now up to 117 with a creatinine of 2.3.  Urine output was 900 cc over the preceding 24 hours.  It appears that she is receiving 30 cc every 2 hours of free water flush.   Medications:  Current medications: Humalog sliding scale insulin, amantadine 100 mg daily, atorvastatin 10 mg nightly, carvedilol 12.5 mg twice daily, citalopram 10 mg daily, clonazepam 0.25 mcg twice daily, diltiazem 30 mg every 6 hours, Eliquis 2.5 mg twice daily, fish oil 6 g daily, folic acid 0.5 mg daily, gemfibrozil 600 mg daily, hydralazine 100 mg every 8 hours, Lantus 10 units daily loratadine 10 mg daily, melatonin 3 mg nightly, MiraLAX 17 g daily, modafinil 100 mg every morning, Protonix 40 mg twice daily, Seroquel 50 mg nightly and 50 mg daily, Solu-Cortef 50 mg 3 times daily, multivitamin  1 tablet daily, vitamin D 1000 international units daily   Allergies: Allergies  Allergen Reactions  . Cefazolin   . Saxagliptin   . Sulfamethoxazole   . Zosyn [Piperacillin Sod-Tazobactam So]       Past Medical History: Past Medical History:  Diagnosis Date  . Acute on chronic diastolic heart failure ([place])   . Acute on chronic respiratory failure with hypoxia ([place])   . Atrial fibrillation ([place])   . Chronic atrial fibrillation   . Chronic kidney disease (CKD)   . Chronic kidney disease, stage IV (severe) ([place])   . Diabetes mellitus ([place])   . Hypertension   . Pulmonary alveolar hemorrhage      Past Surgical History: Past Surgical History:  Procedure Laterality Date  . ATRIAL ABLATION SURGERY    . EYE SURGERY    . THYROID SURGERY       Family History: No family history on file.   Social History: Social History   Socioeconomic History  . Marital status: Married    Spouse name: Not on file  . Number of children: Not on file  . Years of education: Not on file  . Highest education level: Not on file  Occupational History  . Not on file  Social Needs  . Financial resource strain: Not  on file  . Food insecurity:    Worry: Not on file    Inability: Not on file  . Transportation needs:    Medical: Not on file    Non-medical: Not on file  Tobacco Use  . Smoking status: Never Smoker  . Smokeless tobacco: Never Used  Substance and Sexual Activity  . Alcohol use: Never    Frequency: Never  . Drug use: Never  . Sexual activity: Not on file  Lifestyle  . Physical activity:    Days per week: Not on file    Minutes per session: Not on file  . Stress: Not on file  Relationships  . Social connections:    Talks on phone: Not on file    Gets together: Not on file    Attends religious service: Not on file    Active member of club or organization: Not on file    Attends meetings of clubs or organizations: Not on file    Relationship status: Not on file  .  Intimate partner violence:    Fear of current or ex partner: Not on file    Emotionally abused: Not on file    Physically abused: Not on file    Forced sexual activity: Not on file  Other Topics Concern  . Not on file  Social History Narrative  . Not on file     Review of Systems: Patient unable to provide as she has a tracheostomy in place  Vital Signs: Temperature 95.9 pulse 102 respiration 16 blood pressure 171/82 Weight trends: There were no vitals filed for this visit.  Physical Exam: General: Chronically ill-appearing  Head: Normocephalic, atraumatic.  Eyes: Anicteric, EOMI  Nose: Mucous membranes moist, not inflammed, nonerythematous.  Throat: Oropharynx nonerythematous, no exudate appreciated.   Neck: Tracheostomy in place  Lungs:  Normal respiratory effort.  Scattered rhonchi bilateral.  Heart: S1S2 irregular  Abdomen:  BS normoactive. Soft, Nondistended, non-tender.  No masses or organomegaly.  Extremities: No pretibial edema.  Neurologic: Awake, will follow simple commands  Skin: No visible rashes, scars.    Lab results: Basic Metabolic Panel: Recent Labs  Lab 07/17/18 0744 07/20/18 0613  NA 136 137  K 3.5 2.9*  CL 96* 98  CO2 25 27  GLUCOSE 158* 183*  BUN 109* 117*  CREATININE 2.15* 2.33*  CALCIUM 10.9* 10.0    Liver Function Tests: No results for input(s): AST, ALT, ALKPHOS, BILITOT, PROT, ALBUMIN in the last 168 hours. No results for input(s): LIPASE, AMYLASE in the last 168 hours. No results for input(s): AMMONIA in the last 168 hours.  CBC: Recent Labs  Lab 07/17/18 0744 07/20/18 0613  WBC 11.9* 11.0*  HGB 8.4* 8.6*  HCT 26.3* 27.1*  MCV 93.6 93.1  PLT 161 183    Cardiac Enzymes: No results for input(s): CKTOTAL, CKMB, CKMBINDEX, TROPONINI in the last 168 hours.  BNP: Invalid input(s): POCBNP  CBG: No results for input(s): GLUCAP in the last 168 hours.  Microbiology: No results found for this or any previous  visit.  Coagulation Studies: No results for input(s): LABPROT, INR in the last 72 hours.  Urinalysis: No results for input(s): COLORURINE, LABSPEC, PHURINE, GLUCOSEU, HGBUR, BILIRUBINUR, KETONESUR, PROTEINUR, UROBILINOGEN, NITRITE, LEUKOCYTESUR in the last 72 hours.  Invalid input(s): APPERANCEUR    Imaging: US Renal  Result Date: 07/20/2018 CLINICAL DATA:  Abnormal renal function. EXAM: RENAL / URINARY TRACT ULTRASOUND COMPLETE COMPARISON:  None. FINDINGS: Right Kidney: Renal measurements: 10.9 x 6.0 x 4.9 cm =  volume: 167 mL. Increased echogenicity of renal parenchyma is noted. No mass or hydronephrosis visualized. Left Kidney: Renal measurements: 9.9 x 5.5 x 4.8 cm = volume: 136 mL. Increased echogenicity of renal parenchyma is noted. No mass or hydronephrosis visualized. Bladder: Nondistended due to Foley catheter. IMPRESSION: Increased echogenicity of renal parenchyma is noted consistent with medical renal disease. No hydronephrosis or renal obstruction is noted. Electronically Signed   By: Marijo Conception M.D.   On: 07/20/2018 16:59      Assessment & Plan: Pt is a 78 y.o. female with a PMHx of atrial fibrillation, chronic kidney disease stage III, diabetes mellitus type 2, hypertension,, recent acute on chronic respiratory failure, obstructive sleep apnea, pulmonary hypertension, anemia of chronic kidney disease, secondary hyperparathyroidism, sick sinus syndrome, history of prior aortic valve endocarditis who was admitted to [facility] on 07/02/2018 for ongoing treatment of acute on chronic respiratory failure.   1.  Acute renal failure, suspect due to volume depletion. 2.  Chronic kidney disease stage III baseline creatinine 1.67. 3.  Anemia of chronic kidney disease hemoglobin 8.6. 4.  Secondary hyperparathyroidism.  Plan: Suspect that the patient has developed an element of volume depletion.  To treat this we will start the patient on 0.0 normal saline at 50 cc/h the next  48 hours.  I have also increased free water flushes to 80 cc every 2 hours.  We will continue to monitor renal parameters closely.  No urgent indication for dialysis at the moment as the patient is still making urine and made 900 cc over the preceding 24 hours.  Renal ultrasound shows increased renal echogenicity but no hydronephrosis is noted.  Further plan to be determined in response to treatment above.  Thanks for consultation.

## 2018-07-20 NOTE — Progress Notes (Addendum)
Pulmonary Critical Care Medicine [place]   PULMONARY CRITICAL CARE SERVICE  PROGRESS NOTE  Date of Service: 07/20/2018  Loretta Bell  [id number]  DOB: 06/27/1940   DOA: 07/02/2018  Referring Physician: Merton Border, MD  HPI: Loretta Bell is a 78 y.o. female seen for follow up of Acute on Chronic Respiratory Failure.  Patient continues on aerosol trach collar 20% FiO2 has now been 48 hours on ATC.  Satting well with no distress.    Medications: Reviewed on Rounds  Physical Exam:  Vitals: Pulse 102 respirations 16 BP 171/82 O2 sat 98% temp 98.9  Ventilator Settings ATC 28%  . General: Comfortable at this time . Eyes: Grossly normal lids, irises & conjunctiva . ENT: grossly tongue is normal . Neck: no obvious mass . Cardiovascular: S1 S2 normal no gallop . Respiratory: No rales or rhonchi noted . Abdomen: soft . Skin: no rash seen on limited exam . Musculoskeletal: not rigid . Psychiatric:unable to assess . Neurologic: no seizure no involuntary movements         Lab Data:   Basic Metabolic Panel: Recent Labs  Lab 07/17/18 0744 07/20/18 0613  NA 136 137  K 3.5 2.9*  CL 96* 98  CO2 25 27  GLUCOSE 158* 183*  BUN 109* 117*  CREATININE 2.15* 2.33*  CALCIUM 10.9* 10.0    ABG: No results for input(s): PHART, PCO2ART, PO2ART, HCO3, O2SAT in the last 168 hours.  Liver Function Tests: No results for input(s): AST, ALT, ALKPHOS, BILITOT, PROT, ALBUMIN in the last 168 hours. No results for input(s): LIPASE, AMYLASE in the last 168 hours. No results for input(s): AMMONIA in the last 168 hours.  CBC: Recent Labs  Lab 07/17/18 0744 07/20/18 0613  WBC 11.9* 11.0*  HGB 8.4* 8.6*  HCT 26.3* 27.1*  MCV 93.6 93.1  PLT 161 183    Cardiac Enzymes: No results for input(s): CKTOTAL, CKMB, CKMBINDEX, TROPONINI in the last 168 hours.  BNP (last 3 results) No results for input(s): BNP in the last 8760 hours.  ProBNP (last 3  results) No results for input(s): PROBNP in the last 8760 hours.  Radiological Exams: US Renal  Result Date: 07/20/2018 CLINICAL DATA:  Abnormal renal function. EXAM: RENAL / URINARY TRACT ULTRASOUND COMPLETE COMPARISON:  None. FINDINGS: Right Kidney: Renal measurements: 10.9 x 6.0 x 4.9 cm = volume: 167 mL. Increased echogenicity of renal parenchyma is noted. No mass or hydronephrosis visualized. Left Kidney: Renal measurements: 9.9 x 5.5 x 4.8 cm = volume: 136 mL. Increased echogenicity of renal parenchyma is noted. No mass or hydronephrosis visualized. Bladder: Nondistended due to Foley catheter. IMPRESSION: Increased echogenicity of renal parenchyma is noted consistent with medical renal disease. No hydronephrosis or renal obstruction is noted. Electronically Signed   By: Marijo Conception M.D.   On: 07/20/2018 16:59    Assessment/Plan Active Problems:   Acute on chronic respiratory failure with hypoxia (HCC)   Chronic atrial fibrillation   Chronic kidney disease, stage IV (severe) (HCC)   Pulmonary alveolar hemorrhage   Acute on chronic diastolic heart failure ([place])   1. Acute on chronic respiratory with hypoxia continue weaning on aerosol trach collar.  Will progress to capping soon. 2. Chronic atrial fibrillation rate control 3. Chronic kidney disease stage IV continue monitor 4. Pulmonary hemorrhage resolved 5. Acute on chronic diastolic heart failure at baseline continue present management   I have personally seen and evaluated the patient, evaluated laboratory and imaging results, formulated the  assessment and plan and placed orders. The Patient requires high complexity decision making for assessment and support.  Case was discussed on Rounds with the Respiratory Therapy Staff  Allyne Gee, MD [facility] Pulmonary Critical Care Medicine Sleep Medicine

## 2018-07-21 ENCOUNTER — Other Ambulatory Visit (HOSPITAL_COMMUNITY): Payer: Medicare Other

## 2018-07-21 DIAGNOSIS — I5033 Acute on chronic diastolic (congestive) heart failure: Secondary | ICD-10-CM | POA: Diagnosis not present

## 2018-07-21 DIAGNOSIS — I482 Chronic atrial fibrillation, unspecified: Secondary | ICD-10-CM | POA: Diagnosis not present

## 2018-07-21 DIAGNOSIS — J9621 Acute and chronic respiratory failure with hypoxia: Secondary | ICD-10-CM | POA: Diagnosis not present

## 2018-07-21 DIAGNOSIS — N184 Chronic kidney disease, stage 4 (severe): Secondary | ICD-10-CM | POA: Diagnosis not present

## 2018-07-21 LAB — BASIC METABOLIC PANEL
Anion gap: 13 (ref 5–15)
BUN: 120 mg/dL — ABNORMAL HIGH (ref 8–23)
CO2: 25 mmol/L (ref 22–32)
Calcium: 9.7 mg/dL (ref 8.9–10.3)
Chloride: 100 mmol/L (ref 98–111)
Creatinine, Ser: 2.38 mg/dL — ABNORMAL HIGH (ref 0.44–1.00)
GFR calc Af Amer: 22 mL/min — ABNORMAL LOW (ref >60)
GFR calc non Af Amer: 19 mL/min — ABNORMAL LOW (ref >60)
Glucose, Bld: 154 mg/dL — ABNORMAL HIGH (ref 70–99)
Potassium: 3.8 mmol/L (ref 3.5–5.1)
Sodium: 138 mmol/L (ref 135–145)

## 2018-07-21 LAB — POTASSIUM: Potassium: 3.9 mmol/L (ref 3.5–5.1)

## 2018-07-21 NOTE — Progress Notes (Signed)
Pulmonary Critical Care Medicine [place]   PULMONARY CRITICAL CARE SERVICE  PROGRESS NOTE  Date of Service: 07/21/2018  Loretta Bell  EAV:[id number]  DOB: 01/07/1941   DOA: 07/02/2018  Referring Physician: Merton Border, MD  HPI: Loretta Bell is a 78 y.o. female seen for follow up of Acute on Chronic Respiratory Failure.  Patient is on T collar at this time is been on 28% FiO2 good saturations are 90  Medications: Reviewed on Rounds  Physical Exam:  Vitals: Temperature 99.1 pulse 113 respiratory 27 blood pressure 130/74 saturations 100%  Ventilator Settings off the ventilator right now on T collar  . General: Comfortable at this time . Eyes: Grossly normal lids, irises & conjunctiva . ENT: grossly tongue is normal . Neck: no obvious mass . Cardiovascular: S1 S2 normal no gallop . Respiratory: No rhonchi no rales are noted . Abdomen: soft . Skin: no rash seen on limited exam . Musculoskeletal: not rigid . Psychiatric:unable to assess . Neurologic: no seizure no involuntary movements         Lab Data:   Basic Metabolic Panel: Recent Labs  Lab 07/17/18 0744 07/20/18 0613 07/21/18 0954  NA 136 137  --   K 3.5 2.9* 3.9  CL 96* 98  --   CO2 25 27  --   GLUCOSE 158* 183*  --   BUN 109* 117*  --   CREATININE 2.15* 2.33*  --   CALCIUM 10.9* 10.0  --     ABG: No results for input(s): PHART, PCO2ART, PO2ART, HCO3, O2SAT in the last 168 hours.  Liver Function Tests: No results for input(s): AST, ALT, ALKPHOS, BILITOT, PROT, ALBUMIN in the last 168 hours. No results for input(s): LIPASE, AMYLASE in the last 168 hours. No results for input(s): AMMONIA in the last 168 hours.  CBC: Recent Labs  Lab 07/17/18 0744 07/20/18 0613  WBC 11.9* 11.0*  HGB 8.4* 8.6*  HCT 26.3* 27.1*  MCV 93.6 93.1  PLT 161 183    Cardiac Enzymes: No results for input(s): CKTOTAL, CKMB, CKMBINDEX, TROPONINI in the last 168 hours.  BNP (last 3  results) No results for input(s): BNP in the last 8760 hours.  ProBNP (last 3 results) No results for input(s): PROBNP in the last 8760 hours.  Radiological Exams: US Renal  Result Date: 07/20/2018 CLINICAL DATA:  Abnormal renal function. EXAM: RENAL / URINARY TRACT ULTRASOUND COMPLETE COMPARISON:  None. FINDINGS: Right Kidney: Renal measurements: 10.9 x 6.0 x 4.9 cm = volume: 167 mL. Increased echogenicity of renal parenchyma is noted. No mass or hydronephrosis visualized. Left Kidney: Renal measurements: 9.9 x 5.5 x 4.8 cm = volume: 136 mL. Increased echogenicity of renal parenchyma is noted. No mass or hydronephrosis visualized. Bladder: Nondistended due to Foley catheter. IMPRESSION: Increased echogenicity of renal parenchyma is noted consistent with medical renal disease. No hydronephrosis or renal obstruction is noted. Electronically Signed   By: Marijo Conception M.D.   On: 07/20/2018 16:59   Dg Chest Port 1 View  Result Date: 07/21/2018 CLINICAL DATA:  COPD. EXAM: PORTABLE CHEST 1 VIEW COMPARISON:  07/01/2018 FINDINGS: Tracheostomy tube and left sided pacemaker unchanged. Lungs are adequately inflated with possible mild opacification over the medial right apex. Left base/retrocardiac opacification suggesting effusion with atelectasis. Mild prominence of the perihilar markings likely due to mild vascular congestion. Stable cardiomegaly. Remainder of the exam is unchanged. IMPRESSION: Cardiomegaly with suggestion of mild vascular congestion. Possible mild opacification over the medial right apex.  Left base opacification suggesting effusion with associated atelectasis. Electronically Signed   By: Marin Olp M.D.   On: 07/21/2018 10:16    Assessment/Plan Active Problems:   Acute on chronic respiratory failure with hypoxia (HCC)   Chronic atrial fibrillation   Chronic kidney disease, stage IV (severe) (HCC)   Pulmonary alveolar hemorrhage   Acute on chronic diastolic heart failure  ([place])   1. Acute on chronic respiratory failure hypoxia we will continue with T collar trials titrate oxygen continue pulmonary toilet. 2. Chronic atrial fibrillation rate controlled at this time 3. Chronic kidney disease stage IV we will continue with supportive care 4. Pulmonary hemorrhage follow-up chest x-ray showing some basilar atelectasis along with some effusions 5. Acute on chronic diastolic heart failure diuretics as tolerated   I have personally seen and evaluated the patient, evaluated laboratory and imaging results, formulated the assessment and plan and placed orders. The Patient requires high complexity decision making for assessment and support.  Case was discussed on Rounds with the Respiratory Therapy Staff  Allyne Gee, MD [facility] Pulmonary Critical Care Medicine Sleep Medicine

## 2018-07-22 DIAGNOSIS — N184 Chronic kidney disease, stage 4 (severe): Secondary | ICD-10-CM | POA: Diagnosis not present

## 2018-07-22 DIAGNOSIS — J9621 Acute and chronic respiratory failure with hypoxia: Secondary | ICD-10-CM | POA: Diagnosis not present

## 2018-07-22 DIAGNOSIS — I482 Chronic atrial fibrillation, unspecified: Secondary | ICD-10-CM | POA: Diagnosis not present

## 2018-07-22 DIAGNOSIS — I5033 Acute on chronic diastolic (congestive) heart failure: Secondary | ICD-10-CM | POA: Diagnosis not present

## 2018-07-22 LAB — BASIC METABOLIC PANEL
Anion gap: 14 (ref 5–15)
BUN: 118 mg/dL — ABNORMAL HIGH (ref 8–23)
CO2: 24 mmol/L (ref 22–32)
Calcium: 9.4 mg/dL (ref 8.9–10.3)
Chloride: 98 mmol/L (ref 98–111)
Creatinine, Ser: 2.45 mg/dL — ABNORMAL HIGH (ref 0.44–1.00)
GFR calc Af Amer: 21 mL/min — ABNORMAL LOW (ref >60)
GFR calc non Af Amer: 19 mL/min — ABNORMAL LOW (ref >60)
Glucose, Bld: 206 mg/dL — ABNORMAL HIGH (ref 70–99)
Potassium: 3.5 mmol/L (ref 3.5–5.1)
Sodium: 136 mmol/L (ref 135–145)

## 2018-07-22 NOTE — Progress Notes (Addendum)
Pulmonary Critical Care Medicine [place]   PULMONARY CRITICAL CARE SERVICE  PROGRESS NOTE  Date of Service: 07/22/2018  Dawn Convery  JQB:[id number]  DOB: 01/07/1941   DOA: 07/02/2018  Referring Physician: Merton Border, MD  HPI: Loretta Bell is a 78 y.o. female seen for follow up of Acute on Chronic Respiratory Failure.  Patient remains on aerosol trach collar 28% FiO2.  Satting well with no distress at this time.  Medications: Reviewed on Rounds  Physical Exam:  Vitals: Pulse 116 respirations 20 BP 152/88 O2 sat 97% temp 97.8  Ventilator Settings ATC 28%  . General: Comfortable at this time . Eyes: Grossly normal lids, irises & conjunctiva . ENT: grossly tongue is normal . Neck: no obvious mass . Cardiovascular: S1 S2 normal no gallop . Respiratory: No rales or rhonchi noted . Abdomen: soft . Skin: no rash seen on limited exam . Musculoskeletal: not rigid . Psychiatric:unable to assess . Neurologic: no seizure no involuntary movements         Lab Data:   Basic Metabolic Panel: Recent Labs  Lab 07/17/18 0744 07/20/18 0613 07/21/18 0954 07/21/18 1855 07/22/18 0609  NA 136 137  --  138 136  K 3.5 2.9* 3.9 3.8 3.5  CL 96* 98  --  100 98  CO2 25 27  --  25 24  GLUCOSE 158* 183*  --  154* 206*  BUN 109* 117*  --  120* 118*  CREATININE 2.15* 2.33*  --  2.38* 2.45*  CALCIUM 10.9* 10.0  --  9.7 9.4    ABG: No results for input(s): PHART, PCO2ART, PO2ART, HCO3, O2SAT in the last 168 hours.  Liver Function Tests: No results for input(s): AST, ALT, ALKPHOS, BILITOT, PROT, ALBUMIN in the last 168 hours. No results for input(s): LIPASE, AMYLASE in the last 168 hours. No results for input(s): AMMONIA in the last 168 hours.  CBC: Recent Labs  Lab 07/17/18 0744 07/20/18 0613  WBC 11.9* 11.0*  HGB 8.4* 8.6*  HCT 26.3* 27.1*  MCV 93.6 93.1  PLT 161 183    Cardiac Enzymes: No results for input(s): CKTOTAL, CKMB, CKMBINDEX,  TROPONINI in the last 168 hours.  BNP (last 3 results) No results for input(s): BNP in the last 8760 hours.  ProBNP (last 3 results) No results for input(s): PROBNP in the last 8760 hours.  Radiological Exams: US Renal  Result Date: 07/20/2018 CLINICAL DATA:  Abnormal renal function. EXAM: RENAL / URINARY TRACT ULTRASOUND COMPLETE COMPARISON:  None. FINDINGS: Right Kidney: Renal measurements: 10.9 x 6.0 x 4.9 cm = volume: 167 mL. Increased echogenicity of renal parenchyma is noted. No mass or hydronephrosis visualized. Left Kidney: Renal measurements: 9.9 x 5.5 x 4.8 cm = volume: 136 mL. Increased echogenicity of renal parenchyma is noted. No mass or hydronephrosis visualized. Bladder: Nondistended due to Foley catheter. IMPRESSION: Increased echogenicity of renal parenchyma is noted consistent with medical renal disease. No hydronephrosis or renal obstruction is noted. Electronically Signed   By: Marijo Conception M.D.   On: 07/20/2018 16:59   Dg Chest Port 1 View  Result Date: 07/21/2018 CLINICAL DATA:  COPD. EXAM: PORTABLE CHEST 1 VIEW COMPARISON:  07/01/2018 FINDINGS: Tracheostomy tube and left sided pacemaker unchanged. Lungs are adequately inflated with possible mild opacification over the medial right apex. Left base/retrocardiac opacification suggesting effusion with atelectasis. Mild prominence of the perihilar markings likely due to mild vascular congestion. Stable cardiomegaly. Remainder of the exam is unchanged. IMPRESSION: Cardiomegaly with  suggestion of mild vascular congestion. Possible mild opacification over the medial right apex. Left base opacification suggesting effusion with associated atelectasis. Electronically Signed   By: Marin Olp M.D.   On: 07/21/2018 10:16    Assessment/Plan Active Problems:   Acute on chronic respiratory failure with hypoxia (HCC)   Chronic atrial fibrillation   Chronic kidney disease, stage IV (severe) (HCC)   Pulmonary alveolar hemorrhage    Acute on chronic diastolic heart failure ([place])   1. Acute on chronic respiratory failure hypoxia we will continue with T collar trials titrate oxygen continue pulmonary toilet. 2. Chronic atrial fibrillation rate controlled at this time 3. Chronic kidney disease stage IV we will continue with supportive care 4. Pulmonary hemorrhage follow-up chest x-ray showing some basilar atelectasis along with some effusions 5. Acute on chronic diastolic heart failure diuretics as tolerated   I have personally seen and evaluated the patient, evaluated laboratory and imaging results, formulated the assessment and plan and placed orders. The Patient requires high complexity decision making for assessment and support.  Case was discussed on Rounds with the Respiratory Therapy Staff  Allyne Gee, MD [facility] Pulmonary Critical Care Medicine Sleep Medicine

## 2018-07-22 NOTE — Progress Notes (Signed)
[facility]  ROUNDING NOTE   Subjective:  Patient seen at bedside. Free water flushes were increased to 80 cc every 2 hours. Patient also received 0.9 normal saline at 50 cc/h over the past 2 days. BUN slightly improved to 118 however creatinine higher at 2.45.    Objective:  Vital signs in last 24 hours:  Temperature 97.8 pulse 116 respirations 20 blood pressure 152/88  Physical Exam: General: No acute distress  Head: Normocephalic, atraumatic. Moist oral mucosal membranes  Eyes: Anicteric  Neck: Tracheostomy in place  Lungs:  Scattered rhonchi, normal effort  Heart: S1S2 no rubs  Abdomen:  Soft, nontender, bowel sounds present  Extremities: Trace peripheral edema.  Neurologic: Awake, alert, not consistently following commands  Skin: No lesions       Basic Metabolic Panel: Recent Labs  Lab 07/17/18 0744 07/20/18 0613 07/21/18 0954 07/21/18 1855 07/22/18 0609  NA 136 137  --  138 136  K 3.5 2.9* 3.9 3.8 3.5  CL 96* 98  --  100 98  CO2 25 27  --  25 24  GLUCOSE 158* 183*  --  154* 206*  BUN 109* 117*  --  120* 118*  CREATININE 2.15* 2.33*  --  2.38* 2.45*  CALCIUM 10.9* 10.0  --  9.7 9.4    Liver Function Tests: No results for input(s): AST, ALT, ALKPHOS, BILITOT, PROT, ALBUMIN in the last 168 hours. No results for input(s): LIPASE, AMYLASE in the last 168 hours. No results for input(s): AMMONIA in the last 168 hours.  CBC: Recent Labs  Lab 07/17/18 0744 07/20/18 0613  WBC 11.9* 11.0*  HGB 8.4* 8.6*  HCT 26.3* 27.1*  MCV 93.6 93.1  PLT 161 183    Cardiac Enzymes: No results for input(s): CKTOTAL, CKMB, CKMBINDEX, TROPONINI in the last 168 hours.  BNP: Invalid input(s): POCBNP  CBG: No results for input(s): GLUCAP in the last 168 hours.  Microbiology: No results found for this or any previous visit.  Coagulation Studies: No results for input(s): LABPROT, INR in the last 72 hours.  Urinalysis: No results for input(s):  COLORURINE, LABSPEC, PHURINE, GLUCOSEU, HGBUR, BILIRUBINUR, KETONESUR, PROTEINUR, UROBILINOGEN, NITRITE, LEUKOCYTESUR in the last 72 hours.  Invalid input(s): APPERANCEUR    Imaging: Dg Chest Port 1 View  Result Date: 07/21/2018 CLINICAL DATA:  COPD. EXAM: PORTABLE CHEST 1 VIEW COMPARISON:  07/01/2018 FINDINGS: Tracheostomy tube and left sided pacemaker unchanged. Lungs are adequately inflated with possible mild opacification over the medial right apex. Left base/retrocardiac opacification suggesting effusion with atelectasis. Mild prominence of the perihilar markings likely due to mild vascular congestion. Stable cardiomegaly. Remainder of the exam is unchanged. IMPRESSION: Cardiomegaly with suggestion of mild vascular congestion. Possible mild opacification over the medial right apex. Left base opacification suggesting effusion with associated atelectasis. Electronically Signed   By: Marin Olp M.D.   On: 07/21/2018 10:16     Medications:       Assessment/ Plan:  78 y.o. female with a PMHx of atrial fibrillation, chronic kidney disease stage III, diabetes mellitus type 2, hypertension,, recent acute on chronic respiratory failure, obstructive sleep apnea, pulmonary hypertension, anemia of chronic kidney disease, secondary hyperparathyroidism, sick sinus syndrome, history of prior aortic valve endocarditis who was admitted to [facility] on 07/02/2018 for ongoing treatment of acute on chronic respiratory failure.   1.  Acute renal failure, suspect due to volume depletion. 2.  Chronic kidney disease stage III baseline creatinine 1.67. 3.  Anemia of chronic kidney disease hemoglobin  8.6. 4.  Secondary hyperparathyroidism.  Plan: Patient was provided 0.9 normal saline at 50 cc/h over the past 2 days.  We will likely need to extend this over the weekend.  BUN remains high at this time but suspect that steroids may be playing some role in acidemia.  Patient is producing urine and made  1.1 L over the preceding 24 hours.  No acute indication for dialysis at the moment.  We will continue to monitor renal parameters over the course of the patient's hospitalization.  We will return to see her on Monday.   LOS: 0 Johnaton Sonneborn 6/12/20205:40 PM

## 2018-07-23 DIAGNOSIS — I482 Chronic atrial fibrillation, unspecified: Secondary | ICD-10-CM | POA: Diagnosis not present

## 2018-07-23 DIAGNOSIS — N184 Chronic kidney disease, stage 4 (severe): Secondary | ICD-10-CM | POA: Diagnosis not present

## 2018-07-23 DIAGNOSIS — J9621 Acute and chronic respiratory failure with hypoxia: Secondary | ICD-10-CM | POA: Diagnosis not present

## 2018-07-23 DIAGNOSIS — I5033 Acute on chronic diastolic (congestive) heart failure: Secondary | ICD-10-CM | POA: Diagnosis not present

## 2018-07-23 NOTE — Progress Notes (Addendum)
Pulmonary Critical Care Medicine [place]   PULMONARY CRITICAL CARE SERVICE  PROGRESS NOTE  Date of Service: 07/23/2018  Loretta Bell  JXB:[id number]  DOB: 1940-12-16   DOA: 07/02/2018  Referring Physician: Merton Border, MD  HPI: Loretta Bell is a 78 y.o. female seen for follow up of Acute on Chronic Respiratory Failure.  Patient mains on 20% aerosol trach collar satting well with no distress.  Medications: Reviewed on Rounds  Physical Exam:  Vitals: Pulse 95 respiration 16 BP 34/77 O2 sat 98% temp 97.8  Ventilator Settings ATC 28%  . General: Comfortable at this time . Eyes: Grossly normal lids, irises & conjunctiva . ENT: grossly tongue is normal . Neck: no obvious mass . Cardiovascular: S1 S2 normal no gallop . Respiratory: No rales or rhonchi noted . Abdomen: soft . Skin: no rash seen on limited exam . Musculoskeletal: not rigid . Psychiatric:unable to assess . Neurologic: no seizure no involuntary movements         Lab Data:   Basic Metabolic Panel: Recent Labs  Lab 07/17/18 0744 07/20/18 0613 07/21/18 0954 07/21/18 1855 07/22/18 0609  NA 136 137  --  138 136  K 3.5 2.9* 3.9 3.8 3.5  CL 96* 98  --  100 98  CO2 25 27  --  25 24  GLUCOSE 158* 183*  --  154* 206*  BUN 109* 117*  --  120* 118*  CREATININE 2.15* 2.33*  --  2.38* 2.45*  CALCIUM 10.9* 10.0  --  9.7 9.4    ABG: No results for input(s): PHART, PCO2ART, PO2ART, HCO3, O2SAT in the last 168 hours.  Liver Function Tests: No results for input(s): AST, ALT, ALKPHOS, BILITOT, PROT, ALBUMIN in the last 168 hours. No results for input(s): LIPASE, AMYLASE in the last 168 hours. No results for input(s): AMMONIA in the last 168 hours.  CBC: Recent Labs  Lab 07/17/18 0744 07/20/18 0613  WBC 11.9* 11.0*  HGB 8.4* 8.6*  HCT 26.3* 27.1*  MCV 93.6 93.1  PLT 161 183    Cardiac Enzymes: No results for input(s): CKTOTAL, CKMB, CKMBINDEX, TROPONINI in the last  168 hours.  BNP (last 3 results) No results for input(s): BNP in the last 8760 hours.  ProBNP (last 3 results) No results for input(s): PROBNP in the last 8760 hours.  Radiological Exams: No results found.  Assessment/Plan Active Problems:   Acute on chronic respiratory failure with hypoxia (HCC)   Chronic atrial fibrillation   Chronic kidney disease, stage IV (severe) (HCC)   Pulmonary alveolar hemorrhage   Acute on chronic diastolic heart failure ([place])   1. Acute on chronic respiratory failure hypoxia we will continue with T collar trials titrate oxygen continue pulmonary toilet and supportive measures.. 2. Chronic atrial fibrillation rate controlled at this time 3. Chronic kidney disease stage IV we will continue with supportive care 4. Pulmonary hemorrhage follow-up chest x-ray showing some basilar atelectasis along with some effusions 5. Acute on chronic diastolic heart failure diuretics as tolerated   I have personally seen and evaluated the patient, evaluated laboratory and imaging results, formulated the assessment and plan and placed orders. The Patient requires high complexity decision making for assessment and support.  Case was discussed on Rounds with the Respiratory Therapy Staff  Allyne Gee, MD [facility] Pulmonary Critical Care Medicine Sleep Medicine

## 2018-07-24 DIAGNOSIS — J9621 Acute and chronic respiratory failure with hypoxia: Secondary | ICD-10-CM | POA: Diagnosis not present

## 2018-07-24 DIAGNOSIS — N184 Chronic kidney disease, stage 4 (severe): Secondary | ICD-10-CM | POA: Diagnosis not present

## 2018-07-24 DIAGNOSIS — I482 Chronic atrial fibrillation, unspecified: Secondary | ICD-10-CM | POA: Diagnosis not present

## 2018-07-24 DIAGNOSIS — I5033 Acute on chronic diastolic (congestive) heart failure: Secondary | ICD-10-CM | POA: Diagnosis not present

## 2018-07-24 NOTE — Progress Notes (Addendum)
Pulmonary Critical Care Medicine [place]   PULMONARY CRITICAL CARE SERVICE  PROGRESS NOTE  Date of Service: 07/24/2018  Loretta Bell  ALP:[id number]  DOB: 09-29-1940   DOA: 07/02/2018  Referring Physician: Merton Border, MD  HPI: Loretta Bell is a 78 y.o. female seen for follow up of Acute on Chronic Respiratory Failure.  Patient remains on T-bar 20% FiO2 satting well with no distress fever noted.  Medications: Reviewed on Rounds  Physical Exam:  Vitals: Pulse 120 respirations 27 BP 138/82 O2 sat 98% temp 97.7  Ventilator Settings T-bar 28%  . General: Comfortable at this time . Eyes: Grossly normal lids, irises & conjunctiva . ENT: grossly tongue is normal . Neck: no obvious mass . Cardiovascular: S1 S2 normal no gallop . Respiratory: No rales or rhonchi noted . Abdomen: soft . Skin: no rash seen on limited exam . Musculoskeletal: not rigid . Psychiatric:unable to assess . Neurologic: no seizure no involuntary movements         Lab Data:   Basic Metabolic Panel: Recent Labs  Lab 07/20/18 0613 07/21/18 0954 07/21/18 1855 07/22/18 0609  NA 137  --  138 136  K 2.9* 3.9 3.8 3.5  CL 98  --  100 98  CO2 27  --  25 24  GLUCOSE 183*  --  154* 206*  BUN 117*  --  120* 118*  CREATININE 2.33*  --  2.38* 2.45*  CALCIUM 10.0  --  9.7 9.4    ABG: No results for input(s): PHART, PCO2ART, PO2ART, HCO3, O2SAT in the last 168 hours.  Liver Function Tests: No results for input(s): AST, ALT, ALKPHOS, BILITOT, PROT, ALBUMIN in the last 168 hours. No results for input(s): LIPASE, AMYLASE in the last 168 hours. No results for input(s): AMMONIA in the last 168 hours.  CBC: Recent Labs  Lab 07/20/18 0613  WBC 11.0*  HGB 8.6*  HCT 27.1*  MCV 93.1  PLT 183    Cardiac Enzymes: No results for input(s): CKTOTAL, CKMB, CKMBINDEX, TROPONINI in the last 168 hours.  BNP (last 3 results) No results for input(s): BNP in the last 8760  hours.  ProBNP (last 3 results) No results for input(s): PROBNP in the last 8760 hours.  Radiological Exams: No results found.  Assessment/Plan Active Problems:   Acute on chronic respiratory failure with hypoxia (HCC)   Chronic atrial fibrillation   Chronic kidney disease, stage IV (severe) (HCC)   Pulmonary alveolar hemorrhage   Acute on chronic diastolic heart failure ([place])   1. Acute on chronic respiratory failure hypoxia we will continue with T collar trials titrate oxygen continue pulmonary toilet and supportive measures.. 2. Chronic atrial fibrillation rate controlled at this time 3. Chronic kidney disease stage IV we will continue with supportive care 4. Pulmonary hemorrhage follow-up chest x-ray showing some basilar atelectasis along with some effusions 5. Acute on chronic diastolic heart failure diuretics as tolerated   I have personally seen and evaluated the patient, evaluated laboratory and imaging results, formulated the assessment and plan and placed orders. The Patient requires high complexity decision making for assessment and support.  Case was discussed on Rounds with the Respiratory Therapy Staff  Allyne Gee, MD [facility] Pulmonary Critical Care Medicine Sleep Medicine

## 2018-07-25 DIAGNOSIS — I482 Chronic atrial fibrillation, unspecified: Secondary | ICD-10-CM | POA: Diagnosis not present

## 2018-07-25 DIAGNOSIS — N184 Chronic kidney disease, stage 4 (severe): Secondary | ICD-10-CM | POA: Diagnosis not present

## 2018-07-25 DIAGNOSIS — J9621 Acute and chronic respiratory failure with hypoxia: Secondary | ICD-10-CM | POA: Diagnosis not present

## 2018-07-25 DIAGNOSIS — I5033 Acute on chronic diastolic (congestive) heart failure: Secondary | ICD-10-CM | POA: Diagnosis not present

## 2018-07-25 LAB — BASIC METABOLIC PANEL
Anion gap: 13 (ref 5–15)
BUN: 106 mg/dL — ABNORMAL HIGH (ref 8–23)
CO2: 22 mmol/L (ref 22–32)
Calcium: 8.6 mg/dL — ABNORMAL LOW (ref 8.9–10.3)
Chloride: 101 mmol/L (ref 98–111)
Creatinine, Ser: 2.18 mg/dL — ABNORMAL HIGH (ref 0.44–1.00)
GFR calc Af Amer: 25 mL/min — ABNORMAL LOW (ref >60)
GFR calc non Af Amer: 21 mL/min — ABNORMAL LOW (ref >60)
Glucose, Bld: 185 mg/dL — ABNORMAL HIGH (ref 70–99)
Potassium: 3 mmol/L — ABNORMAL LOW (ref 3.5–5.1)
Sodium: 136 mmol/L (ref 135–145)

## 2018-07-25 NOTE — Progress Notes (Addendum)
Pulmonary Critical Care Medicine [place]   PULMONARY CRITICAL CARE SERVICE  PROGRESS NOTE  Date of Service: 07/25/2018  Loretta Bell  IOE:[id number]  DOB: 01/08/1941   DOA: 07/02/2018  Referring Physician: Merton Border, MD  HPI: Loretta Bell is a 78 y.o. female seen for follow up of Acute on Chronic Respiratory Failure.  Patient remains on aerosol trach collar 28% FiO2 at this time.  Satting well with no distress.  Medications: Reviewed on Rounds  Physical Exam:  Vitals: Pulse 103 respiration 39 BP 138/81 O2 sat 99% 76.5  Ventilator Settings ATC 28%  . General: Comfortable at this time . Eyes: Grossly normal lids, irises & conjunctiva . ENT: grossly tongue is normal . Neck: no obvious mass . Cardiovascular: S1 S2 normal no gallop . Respiratory: No rales or pain noted . Abdomen: soft . Skin: no rash seen on limited exam . Musculoskeletal: not rigid . Psychiatric:unable to assess . Neurologic: no seizure no involuntary movements         Lab Data:   Basic Metabolic Panel: Recent Labs  Lab 07/20/18 0613 07/21/18 0954 07/21/18 1855 07/22/18 0609 07/25/18 0652  NA 137  --  138 136 136  K 2.9* 3.9 3.8 3.5 3.0*  CL 98  --  100 98 101  CO2 27  --  25 24 22   GLUCOSE 183*  --  154* 206* 185*  BUN 117*  --  120* 118* 106*  CREATININE 2.33*  --  2.38* 2.45* 2.18*  CALCIUM 10.0  --  9.7 9.4 8.6*    ABG: No results for input(s): PHART, PCO2ART, PO2ART, HCO3, O2SAT in the last 168 hours.  Liver Function Tests: No results for input(s): AST, ALT, ALKPHOS, BILITOT, PROT, ALBUMIN in the last 168 hours. No results for input(s): LIPASE, AMYLASE in the last 168 hours. No results for input(s): AMMONIA in the last 168 hours.  CBC: Recent Labs  Lab 07/20/18 0613  WBC 11.0*  HGB 8.6*  HCT 27.1*  MCV 93.1  PLT 183    Cardiac Enzymes: No results for input(s): CKTOTAL, CKMB, CKMBINDEX, TROPONINI in the last 168 hours.  BNP (last 3  results) No results for input(s): BNP in the last 8760 hours.  ProBNP (last 3 results) No results for input(s): PROBNP in the last 8760 hours.  Radiological Exams: No results found.  Assessment/Plan Active Problems:   Acute on chronic respiratory failure with hypoxia (HCC)   Chronic atrial fibrillation   Chronic kidney disease, stage IV (severe) (HCC)   Pulmonary alveolar hemorrhage   Acute on chronic diastolic heart failure ([place])   1. Acute on chronic respiratory failure hypoxia we will continue with T collar trials titrate oxygen continue pulmonary toiletand supportive measures.. 2. Chronic atrial fibrillation rate controlled at this time 3. Chronic kidney disease stage IV we will continue with supportive care 4. Pulmonary hemorrhage follow-up chest x-ray showing some basilar atelectasis along with some effusions 5. Acute on chronic diastolic heart failure diuretics as tolerated   I have personally seen and evaluated the patient, evaluated laboratory and imaging results, formulated the assessment and plan and placed orders. The Patient requires high complexity decision making for assessment and support.  Case was discussed on Rounds with the Respiratory Therapy Staff  Allyne Gee, MD [facility] Pulmonary Critical Care Medicine Sleep Medicine

## 2018-07-25 NOTE — Progress Notes (Signed)
  [facility]  ROUNDING NOTE   Subjective:  BUN is down to 106 with a creatinine of 2.18. Potassium also a bit low at 3.0. Patient to be taken off of normal saline this a.m.    Objective:  Vital signs in last 24 hours:  Temperature 96.5 pulse 102 respirations 34 blood pressure 137/81  Physical Exam: General: No acute distress  Head: Normocephalic, atraumatic. Moist oral mucosal membranes  Eyes: Anicteric  Neck: Tracheostomy in place  Lungs:  Scattered rhonchi, normal effort  Heart: S1S2 no rubs  Abdomen:  Soft, nontender, bowel sounds present  Extremities: Trace peripheral edema.  Neurologic: Awake, alert, not consistently following commands  Skin: No lesions       Basic Metabolic Panel: Recent Labs  Lab 07/20/18 0613 07/21/18 0954 07/21/18 1855 07/22/18 0609 07/25/18 0652  NA 137  --  138 136 136  K 2.9* 3.9 3.8 3.5 3.0*  CL 98  --  100 98 101  CO2 27  --  25 24 22   GLUCOSE 183*  --  154* 206* 185*  BUN 117*  --  120* 118* 106*  CREATININE 2.33*  --  2.38* 2.45* 2.18*  CALCIUM 10.0  --  9.7 9.4 8.6*    Liver Function Tests: No results for input(s): AST, ALT, ALKPHOS, BILITOT, PROT, ALBUMIN in the last 168 hours. No results for input(s): LIPASE, AMYLASE in the last 168 hours. No results for input(s): AMMONIA in the last 168 hours.  CBC: Recent Labs  Lab 07/20/18 0613  WBC 11.0*  HGB 8.6*  HCT 27.1*  MCV 93.1  PLT 183    Cardiac Enzymes: No results for input(s): CKTOTAL, CKMB, CKMBINDEX, TROPONINI in the last 168 hours.  BNP: Invalid input(s): POCBNP  CBG: No results for input(s): GLUCAP in the last 168 hours.  Microbiology: No results found for this or any previous visit.  Coagulation Studies: No results for input(s): LABPROT, INR in the last 72 hours.  Urinalysis: No results for input(s): COLORURINE, LABSPEC, PHURINE, GLUCOSEU, HGBUR, BILIRUBINUR, KETONESUR, PROTEINUR, UROBILINOGEN, NITRITE, LEUKOCYTESUR in the last 72  hours.  Invalid input(s): APPERANCEUR    Imaging: No results found.   Medications:       Assessment/ Plan:  78 y.o. female with a PMHx of atrial fibrillation, chronic kidney disease stage III, diabetes mellitus type 2, hypertension,, recent acute on chronic respiratory failure, obstructive sleep apnea, pulmonary hypertension, anemia of chronic kidney disease, secondary hyperparathyroidism, sick sinus syndrome, history of prior aortic valve endocarditis who was admitted to [facility] on 07/02/2018 for ongoing treatment of acute on chronic respiratory failure.   1.  Acute renal failure, suspect due to volume depletion. 2.  Chronic kidney disease stage III baseline creatinine 1.67. 3.  Anemia of chronic kidney disease hemoglobin 8.6. 4.  Secondary hyperparathyroidism.  Plan: The patient's azotemia has improved a bit.  BUN currently 106 with a creatinine of 2.18.  We will go ahead and discontinue IV fluids for now.  Continue free water flush.  Suspect that steroids are also playing a role in elevated BUN.  Replete serum potassium today.  No indication for dialysis at the moment.  We will continue to monitor along with you.   LOS: 0 Loretta Bell 6/15/20209:04 AM

## 2018-07-26 DIAGNOSIS — I482 Chronic atrial fibrillation, unspecified: Secondary | ICD-10-CM | POA: Diagnosis not present

## 2018-07-26 DIAGNOSIS — N184 Chronic kidney disease, stage 4 (severe): Secondary | ICD-10-CM | POA: Diagnosis not present

## 2018-07-26 DIAGNOSIS — I5033 Acute on chronic diastolic (congestive) heart failure: Secondary | ICD-10-CM | POA: Diagnosis not present

## 2018-07-26 DIAGNOSIS — J9621 Acute and chronic respiratory failure with hypoxia: Secondary | ICD-10-CM | POA: Diagnosis not present

## 2018-07-26 LAB — BASIC METABOLIC PANEL
Anion gap: 11 (ref 5–15)
BUN: 105 mg/dL — ABNORMAL HIGH (ref 8–23)
CO2: 23 mmol/L (ref 22–32)
Calcium: 8.6 mg/dL — ABNORMAL LOW (ref 8.9–10.3)
Chloride: 101 mmol/L (ref 98–111)
Creatinine, Ser: 2.17 mg/dL — ABNORMAL HIGH (ref 0.44–1.00)
GFR calc Af Amer: 25 mL/min — ABNORMAL LOW (ref >60)
GFR calc non Af Amer: 21 mL/min — ABNORMAL LOW (ref >60)
Glucose, Bld: 187 mg/dL — ABNORMAL HIGH (ref 70–99)
Potassium: 4.3 mmol/L (ref 3.5–5.1)
Sodium: 135 mmol/L (ref 135–145)

## 2018-07-26 NOTE — Progress Notes (Addendum)
Pulmonary Critical Care Medicine [place]   PULMONARY CRITICAL CARE SERVICE  PROGRESS NOTE  Date of Service: 07/26/2018  Doloras [place]  KTG:[id number]  DOB: 1940-12-26   DOA: 07/02/2018  Referring Physician: Merton Border, MD  HPI: Lucine Bilski is a 78 y.o. female seen for follow up of Acute on Chronic Respiratory Failure.  Patient continues on aerosol trach collar 28% FiO2 satting well with no distress.  Medications: Reviewed on Rounds  Physical Exam:  Vitals: Pulse 116 respirations 21 BP 117/96 O2 sat 97% temp 96.3  Ventilator Settings ATC 28%  . General: Comfortable at this time . Eyes: Grossly normal lids, irises & conjunctiva . ENT: grossly tongue is normal . Neck: no obvious mass . Cardiovascular: S1 S2 normal no gallop . Respiratory: No rales or rhonchi noted . Abdomen: soft . Skin: no rash seen on limited exam . Musculoskeletal: not rigid . Psychiatric:unable to assess . Neurologic: no seizure no involuntary movements         Lab Data:   Basic Metabolic Panel: Recent Labs  Lab 07/20/18 0613 07/21/18 0954 07/21/18 1855 07/22/18 0609 07/25/18 0652 07/26/18 0638  NA 137  --  138 136 136 135  K 2.9* 3.9 3.8 3.5 3.0* 4.3  CL 98  --  100 98 101 101  CO2 27  --  25 24 22 23   GLUCOSE 183*  --  154* 206* 185* 187*  BUN 117*  --  120* 118* 106* 105*  CREATININE 2.33*  --  2.38* 2.45* 2.18* 2.17*  CALCIUM 10.0  --  9.7 9.4 8.6* 8.6*    ABG: No results for input(s): PHART, PCO2ART, PO2ART, HCO3, O2SAT in the last 168 hours.  Liver Function Tests: No results for input(s): AST, ALT, ALKPHOS, BILITOT, PROT, ALBUMIN in the last 168 hours. No results for input(s): LIPASE, AMYLASE in the last 168 hours. No results for input(s): AMMONIA in the last 168 hours.  CBC: Recent Labs  Lab 07/20/18 0613  WBC 11.0*  HGB 8.6*  HCT 27.1*  MCV 93.1  PLT 183    Cardiac Enzymes: No results for input(s): CKTOTAL, CKMB, CKMBINDEX,  TROPONINI in the last 168 hours.  BNP (last 3 results) No results for input(s): BNP in the last 8760 hours.  ProBNP (last 3 results) No results for input(s): PROBNP in the last 8760 hours.  Radiological Exams: No results found.  Assessment/Plan Active Problems:   Acute on chronic respiratory failure with hypoxia (HCC)   Chronic atrial fibrillation   Chronic kidney disease, stage IV (severe) (HCC)   Pulmonary alveolar hemorrhage   Acute on chronic diastolic heart failure ([place])   1. Acute on chronic respiratory failure hypoxia we will continue with T collar trials 28% FiO2, continue titrate oxygen as well as pulmonary toiletand supportive measures.. 2. Chronic atrial fibrillation rate controlled at this time 3. Chronic kidney disease stage IV we will continue with supportive care 4. Pulmonary hemorrhage follow-up chest x-ray showing some basilar atelectasis along with some effusions 5. Acute on chronic diastolic heart failure diuretics as tolerated   I have personally seen and evaluated the patient, evaluated laboratory and imaging results, formulated the assessment and plan and placed orders. The Patient requires high complexity decision making for assessment and support.  Case was discussed on Rounds with the Respiratory Therapy Staff  Allyne Gee, MD [facility] Pulmonary Critical Care Medicine Sleep Medicine

## 2018-07-27 DIAGNOSIS — N184 Chronic kidney disease, stage 4 (severe): Secondary | ICD-10-CM | POA: Diagnosis not present

## 2018-07-27 DIAGNOSIS — I5033 Acute on chronic diastolic (congestive) heart failure: Secondary | ICD-10-CM | POA: Diagnosis not present

## 2018-07-27 DIAGNOSIS — I482 Chronic atrial fibrillation, unspecified: Secondary | ICD-10-CM | POA: Diagnosis not present

## 2018-07-27 DIAGNOSIS — J9621 Acute and chronic respiratory failure with hypoxia: Secondary | ICD-10-CM | POA: Diagnosis not present

## 2018-07-27 NOTE — Progress Notes (Addendum)
Pulmonary Critical Care Medicine [place]   PULMONARY CRITICAL CARE SERVICE  PROGRESS NOTE  Date of Service: 07/27/2018  Loretta Bell  RWE:[id number]  DOB: [birth year]   DOA: 07/02/2018  Referring Physician: Merton Border, MD  HPI: Loretta Bell is a 78 y.o. female seen for follow up of Acute on Chronic Respiratory Failure.  Patient is on 28% aerosol trach collar satting well with no fever or distress noted.  Medications: Reviewed on Rounds  Physical Exam:  Vitals: Pulse 93 respirations 21 BP 138/91 O2 sat 99% temp 96.7  Ventilator Settings ATC 28%  . General: Comfortable at this time . Eyes: Grossly normal lids, irises & conjunctiva . ENT: grossly tongue is normal . Neck: no obvious mass . Cardiovascular: S1 S2 normal no gallop . Respiratory: No rales or rhonchi noted . Abdomen: soft . Skin: no rash seen on limited exam . Musculoskeletal: not rigid . Psychiatric:unable to assess . Neurologic: no seizure no involuntary movements         Lab Data:   Basic Metabolic Panel: Recent Labs  Lab 07/21/18 0954 07/21/18 1855 07/22/18 0609 07/25/18 0652 07/26/18 0638  NA  --  138 136 136 135  K 3.9 3.8 3.5 3.0* 4.3  CL  --  100 98 101 101  CO2  --  25 24 22 23   GLUCOSE  --  154* 206* 185* 187*  BUN  --  120* 118* 106* 105*  CREATININE  --  2.38* 2.45* 2.18* 2.17*  CALCIUM  --  9.7 9.4 8.6* 8.6*    ABG: No results for input(s): PHART, PCO2ART, PO2ART, HCO3, O2SAT in the last 168 hours.  Liver Function Tests: No results for input(s): AST, ALT, ALKPHOS, BILITOT, PROT, ALBUMIN in the last 168 hours. No results for input(s): LIPASE, AMYLASE in the last 168 hours. No results for input(s): AMMONIA in the last 168 hours.  CBC: No results for input(s): WBC, NEUTROABS, HGB, HCT, MCV, PLT in the last 168 hours.  Cardiac Enzymes: No results for input(s): CKTOTAL, CKMB, CKMBINDEX, TROPONINI in the last 168 hours.  BNP (last 3 results) No  results for input(s): BNP in the last 8760 hours.  ProBNP (last 3 results) No results for input(s): PROBNP in the last 8760 hours.  Radiological Exams: No results found.  Assessment/Plan Active Problems:   Acute on chronic respiratory failure with hypoxia (HCC)   Chronic atrial fibrillation   Chronic kidney disease, stage IV (severe) (HCC)   Pulmonary alveolar hemorrhage   Acute on chronic diastolic heart failure ([place])   1. Acute on chronic respiratory failure hypoxia we will continue with T collar trials 28% FiO2, continue titrate oxygen as well as pulmonary toiletand supportive measures.. 2. Chronic atrial fibrillation rate controlled at this time 3. Chronic kidney disease stage IV we will continue with supportive care 4. Pulmonary hemorrhage follow-up chest x-ray showing some basilar atelectasis along with some effusions 5. Acute on chronic diastolic heart failure diuretics as tolerated   I have personally seen and evaluated the patient, evaluated laboratory and imaging results, formulated the assessment and plan and placed orders. The Patient requires high complexity decision making for assessment and support.  Case was discussed on Rounds with the Respiratory Therapy Staff  Allyne Gee, MD [facility] Pulmonary Critical Care Medicine Sleep Medicine

## 2018-07-27 NOTE — Progress Notes (Signed)
  [facility]  ROUNDING NOTE   Subjective:  No new renal function testing this a.m. however renal function was tested yesterday. BUN at that time was 105 with a creatinine of 2.17.     Objective:  Vital signs in last 24 hours:  Temperature 96.7 pulse 93 respirations 21 blood pressure 138/91  Physical Exam: General: No acute distress  Head: Normocephalic, atraumatic. Moist oral mucosal membranes  Eyes: Anicteric  Neck: Tracheostomy in place  Lungs:  Scattered rhonchi, normal effort  Heart: S1S2 no rubs  Abdomen:  Soft, nontender, bowel sounds present  Extremities: Trace peripheral edema.  Neurologic: Awake, alert, not consistently following commands  Skin: No lesions       Basic Metabolic Panel: Recent Labs  Lab 07/21/18 0954  07/21/18 1855 07/22/18 0609 07/25/18 0652 07/26/18 0638  NA  --   --  138 136 136 135  K 3.9  --  3.8 3.5 3.0* 4.3  CL  --   --  100 98 101 101  CO2  --   --  25 24 22 23   GLUCOSE  --   --  154* 206* 185* 187*  BUN  --   --  120* 118* 106* 105*  CREATININE  --   --  2.38* 2.45* 2.18* 2.17*  CALCIUM  --    < > 9.7 9.4 8.6* 8.6*   < > = values in this interval not displayed.    Liver Function Tests: No results for input(s): AST, ALT, ALKPHOS, BILITOT, PROT, ALBUMIN in the last 168 hours. No results for input(s): LIPASE, AMYLASE in the last 168 hours. No results for input(s): AMMONIA in the last 168 hours.  CBC: No results for input(s): WBC, NEUTROABS, HGB, HCT, MCV, PLT in the last 168 hours.  Cardiac Enzymes: No results for input(s): CKTOTAL, CKMB, CKMBINDEX, TROPONINI in the last 168 hours.  BNP: Invalid input(s): POCBNP  CBG: No results for input(s): GLUCAP in the last 168 hours.  Microbiology: No results found for this or any previous visit.  Coagulation Studies: No results for input(s): LABPROT, INR in the last 72 hours.  Urinalysis: No results for input(s): COLORURINE, LABSPEC, PHURINE, GLUCOSEU, HGBUR,  BILIRUBINUR, KETONESUR, PROTEINUR, UROBILINOGEN, NITRITE, LEUKOCYTESUR in the last 72 hours.  Invalid input(s): APPERANCEUR    Imaging: No results found.   Medications:       Assessment/ Plan:  78 y.o. female with a PMHx of atrial fibrillation, chronic kidney disease stage III, diabetes mellitus type 2, hypertension,, recent acute on chronic respiratory failure, obstructive sleep apnea, pulmonary hypertension, anemia of chronic kidney disease, secondary hyperparathyroidism, sick sinus syndrome, history of prior aortic valve endocarditis who was admitted to [facility] on 07/02/2018 for ongoing treatment of acute on chronic respiratory failure.   1.  Acute renal failure, suspect due to volume depletion. 2.  Chronic kidney disease stage III baseline creatinine 1.67. 3.  Anemia of chronic kidney disease hemoglobin 8.6. 4.  Secondary hyperparathyroidism.  Plan: The patient's creatinine has trended down to 2.1 with a BUN of 105.  She was becoming volume overloaded with saline therefore this was stopped on Monday.  She will continue to receive 80 cc of free water every 2 hours.  No indication for dialysis as the patient made 1.1 L of urine yesterday.  We will continue to monitor UOP and renal parameters.    LOS: 0 Myquan Schaumburg 6/17/202011:07 AM

## 2018-07-28 LAB — NOVEL CORONAVIRUS, NAA (HOSP ORDER, SEND-OUT TO REF LAB; TAT 18-24 HRS): SARS-CoV-2, NAA: NOT DETECTED

## 2018-07-29 ENCOUNTER — Emergency Department (HOSPITAL_COMMUNITY): Payer: Medicare Other

## 2018-07-29 ENCOUNTER — Encounter (HOSPITAL_COMMUNITY): Payer: Self-pay

## 2018-07-29 ENCOUNTER — Other Ambulatory Visit: Payer: Self-pay

## 2018-07-29 ENCOUNTER — Emergency Department (HOSPITAL_COMMUNITY)
Admission: EM | Admit: 2018-07-29 | Discharge: 2018-07-30 | Disposition: A | Discharge status: Home / Self Care | Payer: Medicare Other | Attending: Emergency Medicine | Admitting: Emergency Medicine

## 2018-07-29 DIAGNOSIS — R4182 Altered mental status, unspecified: Secondary | ICD-10-CM | POA: Diagnosis not present

## 2018-07-29 DIAGNOSIS — I5032 Chronic diastolic (congestive) heart failure: Secondary | ICD-10-CM | POA: Diagnosis not present

## 2018-07-29 DIAGNOSIS — I130 Hypertensive heart and chronic kidney disease with heart failure and stage 1 through stage 4 chronic kidney disease, or unspecified chronic kidney disease: Secondary | ICD-10-CM | POA: Diagnosis not present

## 2018-07-29 DIAGNOSIS — N184 Chronic kidney disease, stage 4 (severe): Secondary | ICD-10-CM | POA: Diagnosis not present

## 2018-07-29 DIAGNOSIS — W938XXA Exposure to other excessive cold of man-made origin, initial encounter: Secondary | ICD-10-CM | POA: Insufficient documentation

## 2018-07-29 DIAGNOSIS — T68XXXA Hypothermia, initial encounter: Secondary | ICD-10-CM | POA: Insufficient documentation

## 2018-07-29 DIAGNOSIS — N3000 Acute cystitis without hematuria: Secondary | ICD-10-CM | POA: Diagnosis not present

## 2018-07-29 DIAGNOSIS — E1122 Type 2 diabetes mellitus with diabetic chronic kidney disease: Secondary | ICD-10-CM | POA: Diagnosis not present

## 2018-07-29 LAB — CBC WITH DIFFERENTIAL/PLATELET
Abs Immature Granulocytes: 0.19 10*3/uL — ABNORMAL HIGH (ref 0.00–0.07)
Basophils Absolute: 0 10*3/uL (ref 0.0–0.1)
Basophils Relative: 0 %
Eosinophils Absolute: 0 10*3/uL (ref 0.0–0.5)
Eosinophils Relative: 0 %
HCT: 30.5 % — ABNORMAL LOW (ref 36.0–46.0)
Hemoglobin: 9.4 g/dL — ABNORMAL LOW (ref 12.0–15.0)
Immature Granulocytes: 1 %
Lymphocytes Relative: 4 %
Lymphs Abs: 0.6 10*3/uL — ABNORMAL LOW (ref 0.7–4.0)
MCH: 30 pg (ref 26.0–34.0)
MCHC: 30.8 g/dL (ref 30.0–36.0)
MCV: 97.4 fL (ref 80.0–100.0)
Monocytes Absolute: 0.3 10*3/uL (ref 0.1–1.0)
Monocytes Relative: 2 %
Neutro Abs: 12.1 10*3/uL — ABNORMAL HIGH (ref 1.7–7.7)
Neutrophils Relative %: 93 %
Platelets: 176 10*3/uL (ref 150–400)
RBC: 3.13 MIL/uL — ABNORMAL LOW (ref 3.87–5.11)
RDW: 18.5 % — ABNORMAL HIGH (ref 11.5–15.5)
WBC: 13.2 10*3/uL — ABNORMAL HIGH (ref 4.0–10.5)
nRBC: 0 % (ref 0.0–0.2)

## 2018-07-29 LAB — LIPASE, BLOOD: Lipase: 63 U/L — ABNORMAL HIGH (ref 11–51)

## 2018-07-29 LAB — URINALYSIS, ROUTINE W REFLEX MICROSCOPIC
Bilirubin Urine: NEGATIVE
Glucose, UA: NEGATIVE mg/dL
Ketones, ur: NEGATIVE mg/dL
Nitrite: NEGATIVE
Protein, ur: 30 mg/dL — AB
Specific Gravity, Urine: 1.01 (ref 1.005–1.030)
pH: 6 (ref 5.0–8.0)

## 2018-07-29 LAB — I-STAT CHEM 8, ED
BUN: 95 mg/dL — ABNORMAL HIGH (ref 8–23)
Calcium, Ion: 1.1 mmol/L — ABNORMAL LOW (ref 1.15–1.40)
Chloride: 98 mmol/L (ref 98–111)
Creatinine, Ser: 1.8 mg/dL — ABNORMAL HIGH (ref 0.44–1.00)
Glucose, Bld: 213 mg/dL — ABNORMAL HIGH (ref 70–99)
HCT: 30 % — ABNORMAL LOW (ref 36.0–46.0)
Hemoglobin: 10.2 g/dL — ABNORMAL LOW (ref 12.0–15.0)
Potassium: 3.5 mmol/L (ref 3.5–5.1)
Sodium: 134 mmol/L — ABNORMAL LOW (ref 135–145)
TCO2: 27 mmol/L (ref 22–32)

## 2018-07-29 LAB — HEPATIC FUNCTION PANEL
ALT: 31 U/L (ref 0–44)
AST: 23 U/L (ref 15–41)
Albumin: 2.5 g/dL — ABNORMAL LOW (ref 3.5–5.0)
Alkaline Phosphatase: 128 U/L — ABNORMAL HIGH (ref 38–126)
Bilirubin, Direct: 0.4 mg/dL — ABNORMAL HIGH (ref 0.0–0.2)
Indirect Bilirubin: 0.7 mg/dL (ref 0.3–0.9)
Total Bilirubin: 1.1 mg/dL (ref 0.3–1.2)
Total Protein: 5.4 g/dL — ABNORMAL LOW (ref 6.5–8.1)

## 2018-07-29 LAB — TSH: TSH: 4.139 u[IU]/mL (ref 0.350–4.500)

## 2018-07-29 LAB — LACTIC ACID, PLASMA: Lactic Acid, Venous: 1 mmol/L (ref 0.5–1.9)

## 2018-07-29 MED ORDER — SODIUM CHLORIDE 0.9 % IV SOLN: 1000.0000 mL | INTRAVENOUS | Status: DC

## 2018-07-29 MED ORDER — CIPROFLOXACIN IN D5W 400 MG/200ML IV SOLN
400.0000 mg | Freq: Two times a day (BID) | INTRAVENOUS | Status: DC
  Administered 2018-07-29: 400 mg via INTRAVENOUS
  Filled 2018-07-29: qty 200

## 2018-07-29 MED ORDER — CIPROFLOXACIN HCL 500 MG PO TABS: 500.0000 mg | ORAL_TABLET | Freq: Two times a day (BID) | ORAL | 0 refills | Status: AC

## 2018-07-29 MED ORDER — SODIUM CHLORIDE 0.9 % IV BOLUS
1000.0000 mL | Freq: Once | INTRAVENOUS | Status: AC
  Administered 2018-07-29: 1000 mL via INTRAVENOUS

## 2018-07-29 MED ORDER — SODIUM CHLORIDE 0.9 % IV BOLUS (SEPSIS)
500.0000 mL | Freq: Once | INTRAVENOUS | Status: AC
  Administered 2018-07-29: 500 mL via INTRAVENOUS

## 2018-07-29 NOTE — ED Notes (Addendum)
Pt suctioned w/ a small amount of white, thick fluids collected.

## 2018-07-29 NOTE — ED Notes (Signed)
Pt suctioned. A small amount (unmearsurable) of white, thick fluid was suctioned.

## 2018-07-29 NOTE — Discharge Instructions (Signed)
ake the antibiotics as prescribed, return to the emergency room as needed for worsening symptoms

## 2018-07-29 NOTE — ED Notes (Signed)
Report given to April with [facility] @ [phone]

## 2018-07-29 NOTE — ED Notes (Signed)
Repositioned patient and re-adjusted bair hugger; set to low. Pt appears to be comfortable at this time; w/ no obvious sign of distress. Resting w/ eyes closed. Monitor in place. VSS.

## 2018-07-29 NOTE — ED Notes (Signed)
ED Provider at bedside. 

## 2018-07-29 NOTE — ED Notes (Signed)
Called ptar, said it would be just a bit

## 2018-07-29 NOTE — ED Triage Notes (Signed)
Pt from [facility] with [facility] got hypothermia. Pt temp 94.1 yesterday, latest temp reading 96.1 rectally. Pt arrives to ED alert,  on 3L with T collar, foley in place and IV access to left lower arm.

## 2018-07-29 NOTE — ED Provider Notes (Signed)
MSE was initiated and I personally evaluated the patient and placed orders (if any) at  2:46 PM on July 29, 2018.  The patient appears stable so that the remainder of the MSE may be completed by another provider.  Loretta Bell is a 78 year old female with PMHx DM, HTN, a fib, CHF, CKD stage IV, and chronic respiratory failure on vent who presents to the ED via EMS for hypothermia. Pt discharged from [facility] yesterday and transferred to [facility]; when they took pt's vitals she was found to be hypothermia at 94.1. Pt was placed on warming protocol and they increased her temp to 96.1 prior to arrival to the ED. Temp in the ED 96.2.   Pt tachycardic on exam to 116 as well; will order baseline bloodwork prior to other provider taking over care given end of shift.    Eustaquio Maize, PA-C 07/29/18 [place], MD 07/29/18 [phone]

## 2018-07-29 NOTE — ED Provider Notes (Signed)
[facility] EMERGENCY DEPARTMENT Provider Note   CSN: [id number] Arrival date & time: 07/29/18  1408    History   Chief Complaint Chief Complaint  Patient presents with  . Low Temperature    HPI Loretta Bell is a 78 y.o. female.     HPI Patient has a complex medical history.  Patient was recently in the [facility].  Patient was transferred to [facility] yesterday.  According to the EMS report she was sent to the ED today for hypothermia.  Patient was in the hospital being treated for atrial fibrillation, chronic kidney disease, acute on chronic respiratory failure, history of prior aortic valve endocarditis as well as other medical conditions.  Patient was on a ventilator for an extended period of time.  She has a trach collar but was weaned off the ventilator.  According to the EMS notes the patient had a temperature of 94.1 yesterday.  Today it was 96.1.  Patient has a collar in place and is unable to provide any history. Past Medical History:  Diagnosis Date  . Acute on chronic diastolic heart failure ([place])   . Acute on chronic respiratory failure with hypoxia ([place])   . Atrial fibrillation ([place])   . Chronic atrial fibrillation   . Chronic kidney disease (CKD)   . Chronic kidney disease, stage IV (severe) ([place])   . Diabetes mellitus ([place])   . Hypertension   . Pulmonary alveolar hemorrhage     Patient Active Problem List   Diagnosis Date Noted  . Acute on chronic respiratory failure with hypoxia ([place])   . Chronic atrial fibrillation   . Chronic kidney disease, stage IV (severe) ([place])   . Pulmonary alveolar hemorrhage   . Acute on chronic diastolic heart failure [facility])     Past Surgical History:  Procedure Laterality Date  . ATRIAL ABLATION SURGERY    . EYE SURGERY    . THYROID SURGERY       OB History   No obstetric history on file.      Home Medications    Prior to Admission medications    Medication Sig Start Date End Date Taking? Authorizing Provider  ciprofloxacin (CIPRO) 500 MG tablet Take 1 tablet (500 mg total) by mouth 2 (two) times daily for 7 days. 07/29/18 08/05/18  Dorie Rank, MD    Family History No family history on file.  Social History Social History   Tobacco Use  . Smoking status: Never Smoker  . Smokeless tobacco: Never Used  Substance Use Topics  . Alcohol use: Never    Frequency: Never  . Drug use: Never     Allergies   Cefazolin, Saxagliptin, Sulfamethoxazole, and Zosyn [piperacillin sod-tazobactam so]   Review of Systems Review of Systems  Unable to perform ROS: Mental status change     Physical Exam Updated Vital Signs BP (!) 126/95   Pulse (!) 105   Temp (!) 97.5 F (36.4 C) (Oral)   Resp (!) 22   LMP  (LMP Unknown)   SpO2 100%   Physical Exam Vitals signs and nursing note reviewed.  Constitutional:      Appearance: She is well-developed. She is ill-appearing.  HENT:     Head: Normocephalic and atraumatic.     Right Ear: External ear normal.     Left Ear: External ear normal.  Eyes:     General: No scleral icterus.       Right eye: No discharge.  Left eye: No discharge.     Conjunctiva/sclera: Conjunctivae normal.  Neck:     Musculoskeletal: Neck supple.     Trachea: No tracheal deviation.  Cardiovascular:     Rate and Rhythm: Normal rate and regular rhythm.  Pulmonary:     Effort: Pulmonary effort is normal. No respiratory distress.     Breath sounds: Normal breath sounds. No stridor. No wheezing or rales.  Abdominal:     General: Bowel sounds are normal. There is no distension.     Palpations: Abdomen is soft.     Tenderness: There is no abdominal tenderness. There is no guarding or rebound.  Musculoskeletal:        General: Swelling present. No tenderness.     Comments: Diffuse edema in the extremities, no erythema  Skin:    General: Skin is warm and dry.     Findings: No rash.     Comments: Stage  III decubitus ulcer in the sacral region, approximately 2 cm oval-shaped  Neurological:     Cranial Nerves: No cranial nerve deficit (no facial droop,  ).     Motor: Weakness and abnormal muscle tone present. No seizure activity.     Comments: Patient will look at me when I speak to her however she does not follow any commands,      ED Treatments / Results  Labs (all labs ordered are listed, but only abnormal results are displayed) Labs Reviewed  URINALYSIS, ROUTINE W REFLEX MICROSCOPIC - Abnormal; Notable for the following components:      Result Value   APPearance HAZY (*)    Hgb urine dipstick SMALL (*)    Protein, ur 30 (*)    Leukocytes,Ua LARGE (*)    Bacteria, UA MANY (*)    All other components within normal limits  CBC WITH DIFFERENTIAL/PLATELET - Abnormal; Notable for the following components:   WBC 13.2 (*)    RBC 3.13 (*)    Hemoglobin 9.4 (*)    HCT 30.5 (*)    RDW 18.5 (*)    Neutro Abs 12.1 (*)    Lymphs Abs 0.6 (*)    Abs Immature Granulocytes 0.19 (*)    All other components within normal limits  LIPASE, BLOOD - Abnormal; Notable for the following components:   Lipase 63 (*)    All other components within normal limits  HEPATIC FUNCTION PANEL - Abnormal; Notable for the following components:   Total Protein 5.4 (*)    Albumin 2.5 (*)    Alkaline Phosphatase 128 (*)    Bilirubin, Direct 0.4 (*)    All other components within normal limits  I-STAT CHEM 8, ED - Abnormal; Notable for the following components:   Sodium 134 (*)    BUN 95 (*)    Creatinine, Ser 1.80 (*)    Glucose, Bld 213 (*)    Calcium, Ion 1.10 (*)    Hemoglobin 10.2 (*)    HCT 30.0 (*)    All other components within normal limits  CULTURE, BLOOD (ROUTINE X 2)  CULTURE, BLOOD (ROUTINE X 2)  URINE CULTURE  LACTIC ACID, PLASMA  LACTIC ACID, PLASMA  TSH    EKG EKG Interpretation  Date/Time:  Friday July 29 2018 14:17:00 EDT Ventricular Rate:  99 PR Interval:    QRS Duration:  123 QT Interval:  374 QTC Calculation: 480 R Axis:   76 Text Interpretation:  Atrial fibrillation Nonspecific intraventricular conduction delay Consider anterior infarct Repol abnrm suggests ischemia, lateral leads  No significant change since last tracing Confirmed by Dorie Rank [phone]) on 07/29/2018 3:37:58 PM   Radiology Ct Head Wo Contrast  Result Date: 07/29/2018 CLINICAL DATA:  Altered LOC EXAM: CT HEAD WITHOUT CONTRAST TECHNIQUE: Contiguous axial images were obtained from the base of the skull through the vertex without intravenous contrast. COMPARISON:  None. FINDINGS: Brain: No acute territorial infarction, hemorrhage, or intracranial mass is visualized. Advanced atrophy. Mild small vessel ischemic changes of the white matter. Slightly enlarged ventricles felt secondary to atrophy. Vascular: No hyperdense vessels.  Carotid vascular calcification Skull: Normal. Negative for fracture or focal lesion. Sinuses/Orbits: No acute finding. Other: None IMPRESSION: 1. No CT evidence for acute intracranial abnormality. 2. Atrophy and small vessel ischemic changes of the white matter Electronically Signed   By: Donavan Foil M.D.   On: 07/29/2018 17:16   Dg Chest Port 1 View  Result Date: 07/29/2018 CLINICAL DATA:  Hypothermia. EXAM: PORTABLE CHEST 1 VIEW COMPARISON:  07/21/2018 and 07/01/2018 FINDINGS: There is persistent increased density at the left lung base with air bronchograms consistent with consolidation. There may be an underlying effusion. Overall heart size and vascularity are normal. Right lung is clear. Tracheostomy tube and pacemaker in place. Aortic atherosclerosis. IMPRESSION: Consolidation in the left lung base with possible left effusion, essentially unchanged. Aortic Atherosclerosis (ICD10-I70.0). Electronically Signed   By: Lorriane Shire M.D.   On: 07/29/2018 15:57   Dg Abd Portable 1 View  Result Date: 07/29/2018 CLINICAL DATA:  Altered level of consciousness.  Hypothermia. EXAM:  PORTABLE ABDOMEN - 1 VIEW COMPARISON:  Radiographs 07/04/2018.  CT 07/02/2018. FINDINGS: 1645 hours. Two supine views obtained. There is a percutaneous G-tube with gas in the stomach. The abdomen is otherwise relatively gasless. No distended loops of bowel are identified. There is no supine evidence of free intraperitoneal air. Extensive aortic atherosclerosis is present. The patient has a pacemaker, a left pleural effusion and left basilar airspace disease. IMPRESSION: No definite acute findings. The abdomen is relatively gasless without apparent bowel distension. Electronically Signed   By: Richardean [place] M.D.   On: 07/29/2018 17:03    Procedures Procedures (including critical care time)  Medications Ordered in ED Medications  sodium chloride 0.9 % bolus 1,000 mL (has no administration in time range)  sodium chloride 0.9 % bolus 500 mL (has no administration in time range)    Followed by  0.9 %  sodium chloride infusion (has no administration in time range)  ciprofloxacin (CIPRO) IVPB 400 mg (has no administration in time range)     Initial Impression / Assessment and Plan / ED Course  I have reviewed the triage vital signs and the nursing notes.  Pertinent labs & imaging results that were available during my care of the patient were reviewed by me and considered in my medical decision making (see chart for details).  Clinical Course as of Jul 28 1898  Fri Jul 29, 2018  1656 As reviewed from select stay notes.  Patient's temperature was 96.3 just 3 days ago.   [JK]  1656 Creatinine is decreased from previous   [JK]  1656 Blood cell count and hemoglobin slightly increased from previous   [JK]  1657 Consolidation in lung unchanged according to the x-ray report   [JK]  1802 Episode of tachydysrhythmia while on the monitor.  Resolved spontaneously   [JK]  1848 Discussed case with husband.   She has been having trouble with her temperature since her illness in January.      [  JK]   1857 Urinalysis suggests urinary tract infection.  Will start patient on antibiotics.   [JK]    Clinical Course User Index [JK] Dorie Rank, MD       Patient presented to the ED for evaluation of hypothermia.  In the ED the patient did have a temperature 95-96.  She did respond to external warming.  ED work-up showed chronic abnormalities but no significant changes other than a slight increase in her white blood cell count.  Urinalysis does also show a urinary tract infection.  She does not have a lactic acidosis.  She is afebrile.  I doubt severe sepsis.  Reviewing her old records the patient has had episodes of hypothermia.  Just a couple of days ago while she was in the hospital her temperature was 96.  I think the patient is stable to be discharged back injured hospital facility for continued care.  Final Clinical Impressions(s) / ED Diagnoses   Final diagnoses:  Acute cystitis without hematuria  Hypothermia, initial encounter    ED Discharge Orders         Ordered    ciprofloxacin (CIPRO) 500 MG tablet  2 times daily     07/29/18 1900           Dorie Rank, MD 07/29/18 1900

## 2018-07-30 ENCOUNTER — Telehealth (HOSPITAL_BASED_OUTPATIENT_CLINIC_OR_DEPARTMENT_OTHER): Payer: Self-pay | Admitting: Emergency Medicine

## 2018-07-30 LAB — BLOOD CULTURE ID PANEL (REFLEXED)

## 2018-07-30 NOTE — ED Notes (Signed)
Pt suctioned

## 2018-07-30 NOTE — ED Notes (Signed)
NH staff verbalized understanding of discharge instructions. Opportunity for questioning and answers were provided. Armband removed by staff, pt discharged from ED.

## 2018-08-01 LAB — CULTURE, BLOOD (ROUTINE X 2)

## 2018-08-01 LAB — URINE CULTURE: Culture: >=100000 — AB

## 2018-08-02 ENCOUNTER — Telehealth: Payer: Self-pay | Admitting: Emergency Medicine

## 2018-08-02 NOTE — Telephone Encounter (Signed)
Post ED Visit - Positive Culture Follow-up: Successful Patient Follow-Up  Culture assessed and recommendations reviewed by:  []  Elenor Quinones, Pharm.D. []  Heide Guile, Pharm.D., BCPS AQ-ID []  Parks Neptune, Pharm.D., BCPS []  Alycia Rossetti, Pharm.D., BCPS []  Bertha, Pharm.D., BCPS, AAHIVP []  Legrand Como, Pharm.D., BCPS, AAHIVP []  Salome Arnt, PharmD, BCPS []  Johnnette Gourd, PharmD, BCPS []  Hughes Better, PharmD, BCPS []  Leeroy Cha, PharmD  Positive blood culture  []  Patient discharged without antimicrobial prescription and treatment is now indicated []  Organism is resistant to prescribed ED discharge antimicrobial [x]  Patient with positive blood cultures  Changes discussed with ED provider: Langston Masker PA Results with recommendation for IV Vancomycin and Merrem, results faxed to [facility] @  [phone] attn [address]   Hazle Nordmann 08/02/2018, 10:38 AM

## 2018-08-09 ENCOUNTER — Inpatient Hospital Stay (HOSPITAL_COMMUNITY)
Admission: EM | Admit: 2018-08-09 | Discharge: 2018-09-10 | DRG: 291 | Disposition: E | Payer: Medicare Other | Source: Other Acute Inpatient Hospital | Attending: Internal Medicine | Admitting: Internal Medicine

## 2018-08-09 ENCOUNTER — Other Ambulatory Visit: Payer: Self-pay

## 2018-08-09 ENCOUNTER — Emergency Department (HOSPITAL_COMMUNITY): Payer: Medicare Other

## 2018-08-09 DIAGNOSIS — I482 Chronic atrial fibrillation, unspecified: Secondary | ICD-10-CM | POA: Diagnosis present

## 2018-08-09 DIAGNOSIS — R34 Anuria and oliguria: Secondary | ICD-10-CM | POA: Diagnosis not present

## 2018-08-09 DIAGNOSIS — Z66 Do not resuscitate: Secondary | ICD-10-CM | POA: Diagnosis present

## 2018-08-09 DIAGNOSIS — Z794 Long term (current) use of insulin: Secondary | ICD-10-CM | POA: Diagnosis not present

## 2018-08-09 DIAGNOSIS — B952 Enterococcus as the cause of diseases classified elsewhere: Secondary | ICD-10-CM | POA: Diagnosis present

## 2018-08-09 DIAGNOSIS — N184 Chronic kidney disease, stage 4 (severe): Secondary | ICD-10-CM | POA: Diagnosis present

## 2018-08-09 DIAGNOSIS — Z881 Allergy status to other antibiotic agents status: Secondary | ICD-10-CM | POA: Diagnosis not present

## 2018-08-09 DIAGNOSIS — Z9911 Dependence on respirator [ventilator] status: Secondary | ICD-10-CM | POA: Diagnosis not present

## 2018-08-09 DIAGNOSIS — R0602 Shortness of breath: Secondary | ICD-10-CM

## 2018-08-09 DIAGNOSIS — J189 Pneumonia, unspecified organism: Secondary | ICD-10-CM | POA: Diagnosis not present

## 2018-08-09 DIAGNOSIS — I361 Nonrheumatic tricuspid (valve) insufficiency: Secondary | ICD-10-CM | POA: Diagnosis not present

## 2018-08-09 DIAGNOSIS — J9621 Acute and chronic respiratory failure with hypoxia: Secondary | ICD-10-CM | POA: Diagnosis present

## 2018-08-09 DIAGNOSIS — I130 Hypertensive heart and chronic kidney disease with heart failure and stage 1 through stage 4 chronic kidney disease, or unspecified chronic kidney disease: Principal | ICD-10-CM | POA: Diagnosis present

## 2018-08-09 DIAGNOSIS — I509 Heart failure, unspecified: Secondary | ICD-10-CM

## 2018-08-09 DIAGNOSIS — D696 Thrombocytopenia, unspecified: Secondary | ICD-10-CM | POA: Diagnosis present

## 2018-08-09 DIAGNOSIS — Z515 Encounter for palliative care: Secondary | ICD-10-CM | POA: Diagnosis not present

## 2018-08-09 DIAGNOSIS — Z7901 Long term (current) use of anticoagulants: Secondary | ICD-10-CM | POA: Diagnosis not present

## 2018-08-09 DIAGNOSIS — Z1159 Encounter for screening for other viral diseases: Secondary | ICD-10-CM | POA: Diagnosis not present

## 2018-08-09 DIAGNOSIS — N179 Acute kidney failure, unspecified: Secondary | ICD-10-CM | POA: Diagnosis present

## 2018-08-09 DIAGNOSIS — E871 Hypo-osmolality and hyponatremia: Secondary | ICD-10-CM | POA: Diagnosis present

## 2018-08-09 DIAGNOSIS — Z7952 Long term (current) use of systemic steroids: Secondary | ICD-10-CM | POA: Diagnosis not present

## 2018-08-09 DIAGNOSIS — Z79899 Other long term (current) drug therapy: Secondary | ICD-10-CM

## 2018-08-09 DIAGNOSIS — E876 Hypokalemia: Secondary | ICD-10-CM | POA: Diagnosis present

## 2018-08-09 DIAGNOSIS — I959 Hypotension, unspecified: Secondary | ICD-10-CM | POA: Diagnosis not present

## 2018-08-09 DIAGNOSIS — J9601 Acute respiratory failure with hypoxia: Secondary | ICD-10-CM

## 2018-08-09 DIAGNOSIS — N390 Urinary tract infection, site not specified: Secondary | ICD-10-CM | POA: Diagnosis present

## 2018-08-09 DIAGNOSIS — J9600 Acute respiratory failure, unspecified whether with hypoxia or hypercapnia: Secondary | ICD-10-CM | POA: Diagnosis present

## 2018-08-09 DIAGNOSIS — Z950 Presence of cardiac pacemaker: Secondary | ICD-10-CM

## 2018-08-09 DIAGNOSIS — R7881 Bacteremia: Secondary | ICD-10-CM | POA: Diagnosis present

## 2018-08-09 DIAGNOSIS — I5033 Acute on chronic diastolic (congestive) heart failure: Secondary | ICD-10-CM | POA: Diagnosis present

## 2018-08-09 DIAGNOSIS — Z7189 Other specified counseling: Secondary | ICD-10-CM | POA: Diagnosis not present

## 2018-08-09 DIAGNOSIS — Z7401 Bed confinement status: Secondary | ICD-10-CM | POA: Diagnosis not present

## 2018-08-09 DIAGNOSIS — L8990 Pressure ulcer of unspecified site, unspecified stage: Secondary | ICD-10-CM | POA: Insufficient documentation

## 2018-08-09 DIAGNOSIS — Z930 Tracheostomy status: Secondary | ICD-10-CM

## 2018-08-09 DIAGNOSIS — Z6841 Body Mass Index (BMI) 40.0 and over, adult: Secondary | ICD-10-CM

## 2018-08-09 DIAGNOSIS — E785 Hyperlipidemia, unspecified: Secondary | ICD-10-CM | POA: Diagnosis present

## 2018-08-09 DIAGNOSIS — L89222 Pressure ulcer of left hip, stage 2: Secondary | ICD-10-CM | POA: Diagnosis present

## 2018-08-09 DIAGNOSIS — E11649 Type 2 diabetes mellitus with hypoglycemia without coma: Secondary | ICD-10-CM | POA: Diagnosis present

## 2018-08-09 DIAGNOSIS — Z87440 Personal history of urinary (tract) infections: Secondary | ICD-10-CM

## 2018-08-09 DIAGNOSIS — E1122 Type 2 diabetes mellitus with diabetic chronic kidney disease: Secondary | ICD-10-CM | POA: Diagnosis present

## 2018-08-09 DIAGNOSIS — I340 Nonrheumatic mitral (valve) insufficiency: Secondary | ICD-10-CM | POA: Diagnosis not present

## 2018-08-09 DIAGNOSIS — D649 Anemia, unspecified: Secondary | ICD-10-CM | POA: Diagnosis present

## 2018-08-09 DIAGNOSIS — H409 Unspecified glaucoma: Secondary | ICD-10-CM | POA: Diagnosis present

## 2018-08-09 DIAGNOSIS — L89150 Pressure ulcer of sacral region, unstageable: Secondary | ICD-10-CM | POA: Diagnosis present

## 2018-08-09 LAB — COMPREHENSIVE METABOLIC PANEL
ALT: 24 U/L (ref 0–44)
AST: 30 U/L (ref 15–41)
Albumin: 1.9 g/dL — ABNORMAL LOW (ref 3.5–5.0)
Alkaline Phosphatase: 154 U/L — ABNORMAL HIGH (ref 38–126)
Anion gap: 9 (ref 5–15)
BUN: 87 mg/dL — ABNORMAL HIGH (ref 8–23)
CO2: 25 mmol/L (ref 22–32)
Calcium: 8.5 mg/dL — ABNORMAL LOW (ref 8.9–10.3)
Chloride: 96 mmol/L — ABNORMAL LOW (ref 98–111)
Creatinine, Ser: 1.6 mg/dL — ABNORMAL HIGH (ref 0.44–1.00)
GFR calc Af Amer: 36 mL/min — ABNORMAL LOW (ref >60)
GFR calc non Af Amer: 31 mL/min — ABNORMAL LOW (ref >60)
Glucose, Bld: 138 mg/dL — ABNORMAL HIGH (ref 70–99)
Potassium: 3.7 mmol/L (ref 3.5–5.1)
Sodium: 130 mmol/L — ABNORMAL LOW (ref 135–145)
Total Bilirubin: 1.1 mg/dL (ref 0.3–1.2)
Total Protein: 5 g/dL — ABNORMAL LOW (ref 6.5–8.1)

## 2018-08-09 LAB — URINALYSIS, ROUTINE W REFLEX MICROSCOPIC
Bilirubin Urine: NEGATIVE
Glucose, UA: NEGATIVE mg/dL
Ketones, ur: 5 mg/dL — AB
Nitrite: NEGATIVE
Protein, ur: 100 mg/dL — AB
RBC / HPF: >50 RBC/hpf — ABNORMAL HIGH (ref 0–5)
Specific Gravity, Urine: 1.013 (ref 1.005–1.030)
pH: 6 (ref 5.0–8.0)

## 2018-08-09 LAB — CBC WITH DIFFERENTIAL/PLATELET
Abs Immature Granulocytes: 0.48 10*3/uL — ABNORMAL HIGH (ref 0.00–0.07)
Basophils Absolute: 0 10*3/uL (ref 0.0–0.1)
Basophils Relative: 0 %
Eosinophils Absolute: 0 10*3/uL (ref 0.0–0.5)
Eosinophils Relative: 0 %
HCT: 26.6 % — ABNORMAL LOW (ref 36.0–46.0)
Hemoglobin: 8.2 g/dL — ABNORMAL LOW (ref 12.0–15.0)
Immature Granulocytes: 4 %
Lymphocytes Relative: 8 %
Lymphs Abs: 0.9 10*3/uL (ref 0.7–4.0)
MCH: 29.7 pg (ref 26.0–34.0)
MCHC: 30.8 g/dL (ref 30.0–36.0)
MCV: 96.4 fL (ref 80.0–100.0)
Monocytes Absolute: 0.3 10*3/uL (ref 0.1–1.0)
Monocytes Relative: 3 %
Neutro Abs: 9.6 10*3/uL — ABNORMAL HIGH (ref 1.7–7.7)
Neutrophils Relative %: 85 %
Platelets: 95 10*3/uL — ABNORMAL LOW (ref 150–400)
RBC: 2.76 MIL/uL — ABNORMAL LOW (ref 3.87–5.11)
RDW: 17.7 % — ABNORMAL HIGH (ref 11.5–15.5)
WBC: 11.3 10*3/uL — ABNORMAL HIGH (ref 4.0–10.5)
nRBC: 0.3 % — ABNORMAL HIGH (ref 0.0–0.2)

## 2018-08-09 LAB — POCT I-STAT EG7
Bicarbonate: 25.8 mmol/L (ref 20.0–28.0)
Calcium, Ion: 1.21 mmol/L (ref 1.15–1.40)
HCT: 25 % — ABNORMAL LOW (ref 36.0–46.0)
Hemoglobin: 8.5 g/dL — ABNORMAL LOW (ref 12.0–15.0)
O2 Saturation: 99 %
Potassium: 3.7 mmol/L (ref 3.5–5.1)
Sodium: 132 mmol/L — ABNORMAL LOW (ref 135–145)
TCO2: 27 mmol/L (ref 22–32)
pCO2, Ven: 46.1 mmHg (ref 44.0–60.0)
pH, Ven: 7.355 (ref 7.250–7.430)
pO2, Ven: 125 mmHg — ABNORMAL HIGH (ref 32.0–45.0)

## 2018-08-09 LAB — PROTIME-INR
INR: 1.1 (ref 0.8–1.2)
Prothrombin Time: 14.3 seconds (ref 11.4–15.2)

## 2018-08-09 LAB — CBG MONITORING, ED
Glucose-Capillary: 115 mg/dL — ABNORMAL HIGH (ref 70–99)
Glucose-Capillary: 69 mg/dL — ABNORMAL LOW (ref 70–99)

## 2018-08-09 LAB — LACTIC ACID, PLASMA: Lactic Acid, Venous: 1.1 mmol/L (ref 0.5–1.9)

## 2018-08-09 LAB — SARS CORONAVIRUS 2 BY RT PCR (HOSPITAL ORDER, PERFORMED IN ~~LOC~~ HOSPITAL LAB): SARS Coronavirus 2: NEGATIVE

## 2018-08-09 LAB — BRAIN NATRIURETIC PEPTIDE: B Natriuretic Peptide: 4269.1 pg/mL — ABNORMAL HIGH (ref 0.0–100.0)

## 2018-08-09 MED ORDER — VANCOMYCIN HCL 10 G IV SOLR
1500.0000 mg | Freq: Once | INTRAVENOUS | Status: AC
  Administered 2018-08-10: 1500 mg via INTRAVENOUS
  Filled 2018-08-09: qty 1500

## 2018-08-09 MED ORDER — VANCOMYCIN HCL 10 G IV SOLR
1250.0000 mg | INTRAVENOUS | Status: DC
  Administered 2018-08-11 (×2): 1250 mg via INTRAVENOUS
  Filled 2018-08-09 (×3): qty 1250

## 2018-08-09 MED ORDER — FAMOTIDINE IN NACL 20-0.9 MG/50ML-% IV SOLN
20.0000 mg | INTRAVENOUS | Status: DC
  Administered 2018-08-09 – 2018-08-11 (×2): 20 mg via INTRAVENOUS
  Filled 2018-08-09 (×2): qty 50

## 2018-08-09 MED ORDER — CHLORHEXIDINE GLUCONATE 0.12% ORAL RINSE (MEDLINE KIT)
15.0000 mL | Freq: Two times a day (BID) | OROMUCOSAL | Status: DC
  Administered 2018-08-10 – 2018-08-13 (×8): 15 mL via OROMUCOSAL

## 2018-08-09 MED ORDER — FUROSEMIDE 10 MG/ML IJ SOLN
60.0000 mg | Freq: Once | INTRAMUSCULAR | Status: AC
  Administered 2018-08-09: 60 mg via INTRAVENOUS
  Filled 2018-08-09: qty 6

## 2018-08-09 MED ORDER — SODIUM CHLORIDE 0.9 % IV SOLN
1.0000 g | Freq: Two times a day (BID) | INTRAVENOUS | Status: DC
  Administered 2018-08-09 – 2018-08-12 (×6): 1 g via INTRAVENOUS
  Filled 2018-08-09 (×7): qty 1

## 2018-08-09 MED ORDER — ORAL CARE MOUTH RINSE
15.0000 mL | OROMUCOSAL | Status: DC
  Administered 2018-08-10 – 2018-08-17 (×76): 15 mL via OROMUCOSAL

## 2018-08-09 MED ORDER — DEXTROSE 50 % IV SOLN
12.5000 g | Freq: Once | INTRAVENOUS | Status: AC
  Administered 2018-08-09: 12.5 g via INTRAVENOUS
  Filled 2018-08-09: qty 50

## 2018-08-09 MED ORDER — FENTANYL CITRATE (PF) 100 MCG/2ML IJ SOLN
25.0000 ug | INTRAMUSCULAR | Status: DC | PRN
  Administered 2018-08-14: 25 ug via INTRAVENOUS
  Administered 2018-08-15: 50 ug via INTRAVENOUS
  Administered 2018-08-17: 25 ug via INTRAVENOUS
  Filled 2018-08-09 (×3): qty 2

## 2018-08-09 MED ORDER — INSULIN ASPART 100 UNIT/ML ~~LOC~~ SOLN
0.0000 [IU] | SUBCUTANEOUS | Status: DC
  Administered 2018-08-10: 2 [IU] via SUBCUTANEOUS
  Administered 2018-08-11: 3 [IU] via SUBCUTANEOUS
  Administered 2018-08-11: 7 [IU] via SUBCUTANEOUS
  Administered 2018-08-11: 3 [IU] via SUBCUTANEOUS
  Administered 2018-08-11: 2 [IU] via SUBCUTANEOUS
  Administered 2018-08-11 (×2): 3 [IU] via SUBCUTANEOUS
  Administered 2018-08-11: 5 [IU] via SUBCUTANEOUS
  Administered 2018-08-12: 2 [IU] via SUBCUTANEOUS
  Administered 2018-08-12: 1 [IU] via SUBCUTANEOUS
  Administered 2018-08-12: 2 [IU] via SUBCUTANEOUS
  Administered 2018-08-12: 1 [IU] via SUBCUTANEOUS
  Administered 2018-08-12 – 2018-08-13 (×7): 2 [IU] via SUBCUTANEOUS
  Administered 2018-08-13 – 2018-08-14 (×4): 1 [IU] via SUBCUTANEOUS
  Administered 2018-08-14 – 2018-08-15 (×4): 2 [IU] via SUBCUTANEOUS
  Administered 2018-08-15: 4 [IU] via SUBCUTANEOUS
  Administered 2018-08-16 (×2): 2 [IU] via SUBCUTANEOUS
  Administered 2018-08-16 (×2): 3 [IU] via SUBCUTANEOUS
  Administered 2018-08-16: 09:00:00 1 [IU] via SUBCUTANEOUS
  Administered 2018-08-17: 3 [IU] via SUBCUTANEOUS
  Administered 2018-08-17: 2 [IU] via SUBCUTANEOUS
  Administered 2018-08-17: 3 [IU] via SUBCUTANEOUS
  Administered 2018-08-17: 2 [IU] via SUBCUTANEOUS

## 2018-08-09 NOTE — ED Provider Notes (Signed)
[facility] EMERGENCY DEPARTMENT Provider Note   CSN: [id number] Arrival date & time: 08/01/2018  1525     History   Chief Complaint Chief Complaint  Patient presents with  . Respiratory Distress    HPI Loretta Bell is a 78 y.o. female.     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  Normally patient is on a trach collar but started de-satting today.  Staff noted that over the last 1 week patient has had worsening generalized pitting edema in her upper and lower extremities to the point of anasarca.  They also noted that her renal function is worsening.  They denied new secretions or coughing and she is tested every 4 days for COVID and it has been negative as of yesterday.  Per staff patient is at her baseline which is opening her eyes to sound.  The history is provided by the patient.    Past Medical History:  Diagnosis Date  . Acute on chronic diastolic heart failure ([place])   . Acute on chronic respiratory failure with hypoxia ([place])   . Atrial fibrillation ([place])   . Chronic atrial fibrillation   . Chronic kidney disease (CKD)   . Chronic kidney disease, stage IV (severe) ([place])   . Diabetes mellitus ([place])   . Hypertension   . Pulmonary alveolar hemorrhage     Patient Active Problem List   Diagnosis Date Noted  . Acute on chronic respiratory failure with hypoxia ([place])   . Chronic atrial fibrillation   . Chronic kidney disease, stage IV (severe) ([place])   . Pulmonary alveolar hemorrhage   . Acute on chronic diastolic heart failure [facility])     Past Surgical History:   Procedure Laterality Date  . ATRIAL ABLATION SURGERY    . EYE SURGERY    . THYROID SURGERY       OB History   No obstetric history on file.      Home Medications    Prior to Admission medications   Not on File    Family History No family history on file.  Social History Social History   Tobacco Use  . Smoking status: Never Smoker  . Smokeless tobacco: Never Used  Substance Use Topics  . Alcohol use: Never    Frequency: Never  . Drug use: Never     Allergies   Cefazolin, Saxagliptin, Sulfamethoxazole, and Zosyn [piperacillin sod-tazobactam so]   Review of Systems Review of Systems  All other systems reviewed and are negative.    Physical Exam Updated Vital Signs BP 117/69   Pulse 76   Temp 98.9 F (37.2 C) (Oral)   Resp 16   LMP  (LMP Unknown)   SpO2 100%   Physical Exam Vitals signs and nursing note reviewed.  Constitutional:      General: She is not in acute distress.    Appearance: She is well-developed. She is obese. She is ill-appearing.  HENT:     Head: Normocephalic and atraumatic.     Comments: Significant facial edema with petechia and purpura over the eyes and cheeks Eyes:     Conjunctiva/sclera: Conjunctivae normal.     Pupils: Pupils are equal, round, and reactive to light.  Neck:     Musculoskeletal: Normal range of motion and neck supple.     Comments: Trach collar in place without drainage or erythema around the trach site Cardiovascular:     Rate and Rhythm: Normal rate and regular rhythm.     Heart sounds: No murmur.  Pulmonary:     Effort: Pulmonary effort is normal. No respiratory distress.     Breath sounds: Rhonchi and rales present. No wheezing.  Abdominal:     General: There is distension.     Palpations: Abdomen is soft.     Tenderness: There is no abdominal tenderness. There is no guarding or rebound.  Genitourinary:    Comments: Flexi-Seal present with brown stool, Foley catheter present with red urine concerning  for hematuria Musculoskeletal: Normal range of motion.        General: No tenderness.     Comments: PICC line present in the left upper extremity.  Chronic wound and blister present on the right forearm  Skin:    General: Skin is warm and dry.     Findings: No erythema or rash.     Comments: Petechia present over the chest, upper extremities and lower extremities.  Areas of purpura on the right forearm.  3+ pitting edema of all extremities and edema of the chest abdomen and pelvis consistent with anasarca  Neurological:     Mental Status: She is alert. Mental status is at baseline.     Comments: Patient opens her eyes to voice but is currently vented  Psychiatric:     Comments: Patient is unable to answer questions is vented and minimally responsive      ED Treatments / Results  Labs (all labs ordered are listed, but only abnormal results are displayed) Labs Reviewed  CBC WITH DIFFERENTIAL/PLATELET - Abnormal; Notable for the following components:      Result Value   WBC 11.3 (*)    RBC 2.76 (*)    Hemoglobin 8.2 (*)    HCT 26.6 (*)    RDW 17.7 (*)    Platelets 95 (*)    nRBC 0.3 (*)    Neutro Abs 9.6 (*)    Abs Immature Granulocytes 0.48 (*)    All other components within normal limits  COMPREHENSIVE METABOLIC PANEL - Abnormal; Notable for the following components:   Sodium 130 (*)    Chloride 96 (*)    Glucose, Bld 138 (*)    BUN 87 (*)    Creatinine, Ser 1.60 (*)    Calcium 8.5 (*)    Total Protein 5.0 (*)    Albumin 1.9 (*)    Alkaline Phosphatase 154 (*)    GFR calc non Af Amer 31 (*)    GFR calc Af Amer 36 (*)    All other components within normal limits  BRAIN NATRIURETIC PEPTIDE - Abnormal; Notable for the following components:   B Natriuretic Peptide 4,269.1 (*)    All other components within normal limits  URINALYSIS, ROUTINE W REFLEX MICROSCOPIC - Abnormal; Notable for the following components:   Color, Urine RED (*)    APPearance HAZY (*)    Hgb urine  dipstick MODERATE (*)    Ketones, ur 5 (*)    Protein, ur 100 (*)    Leukocytes,Ua MODERATE (*)    RBC / HPF >50 (*)    Bacteria, UA FEW (*)    All other components within normal limits  POCT I-STAT EG7 - Abnormal; Notable for the following components:  pO2, Ven 125.0 (*)    Sodium 132 (*)    HCT 25.0 (*)    Hemoglobin 8.5 (*)    All other components within normal limits  SARS CORONAVIRUS 2 (HOSPITAL ORDER, [facility])  URINE CULTURE  PROTIME-INR  LACTIC ACID, PLASMA  I-STAT VENOUS BLOOD GAS, ED    EKG EKG Interpretation  Date/Time:  Tuesday August 09 2018 15:35:46 EDT Ventricular Rate:  94 PR Interval:    QRS Duration: 129 QT Interval:  339 QTC Calculation: 424 R Axis:   93 Text Interpretation:  Atrial fibrillation Nonspecific intraventricular conduction delay Consider anterior infarct Nonspecific repol abnormality, diffuse leads No significant change since last tracing Confirmed by Blanchie Dessert ([id number]) on 07/14/2018 4:26:34 PM   Radiology Dg Chest Port 1 View  Result Date: 07/29/2018 CLINICAL DATA:  Shortness of breath. EXAM: PORTABLE CHEST 1 VIEW COMPARISON:  07/29/2018 FINDINGS: The tracheostomy tube is stable.  The pacer wires are stable. Persistent cardiac enlargement and tortuous calcified thoracic aorta. Persistent asymmetric vascular congestion and patchy right lung infiltrates. Persistent left lower lobe density likely combination of effusion, atelectasis and infiltrate. IMPRESSION: 1. Stable tracheostomy tube. 2. Stable cardiac enlargement. 3. Persistent bilateral infiltrates and probable left effusion. Electronically Signed   By: Marijo Sanes M.D.   On: 07/26/2018 16:58    Procedures Procedures (including critical care time)  Medications Ordered in ED Medications - No data to display   Initial Impression / Assessment and Plan / ED Course  I have reviewed the triage vital signs and the nursing notes.  Pertinent labs & imaging  results that were available during my care of the patient were reviewed by me and considered in my medical decision making (see chart for details).        78 year old patient with multiple medical problems presenting today for respiratory failure.  Patient was having desaturation in the nursing home portion of [facility] and when the physician saw her he ordered her to be placed on event and sent to the emergency room.  Per staff patient has had worsening swelling over the last week and worsening in her renal function.  They are tested regularly for COVID and her tested yesterday came back negative which would have been from 4 days ago.  Patient was not reported to be febrile or coughing with secretions.  She does appear significantly fluid overloaded and with report of worsening renal function concerned that that is the cause of her respiratory distress.  On the ventilator patient's oxygen saturation is 100% with normal blood pressure, temperature, heart rate.  She has diffuse anasarca and areas of petechiae and purpura.  Patient is also still on meropenem but prior to that had been on vancomycin, cefepime and Cipro for UTI.  Concerned that maybe some of these medications are causing worsening renal function.  Patient would not be a candidate for dialysis.  Due to the diffuse petechia and purpura and apparent hematuria we will also check PT/INR.  7:41 PM Patient's VBG with normal CO2, COVID is negative, CBC with hemoglobin of 8 last week was 9 so fairly stable and minimal leukocytosis of 11,000.  CMP shows creatinine today 1.6 which is improved from prior with a normal anion gap and elevated BUN.  Platelet count has also dropped to 95 but PT and INR is normal.  Patient's BNP today is elevated at 4000 with a normal lactate and urine without findings concerning for UTI.  Patient given IV Lasix will plan to  admit for diuresis.  At this time not giving any further antibiotics because there is no evidence of  sepsis or new infection.  Final Clinical Impressions(s) / ED Diagnoses   Final diagnoses:  Acute respiratory failure with hypoxia (HCC)  Acute on chronic congestive heart failure, unspecified heart failure type [facility])    ED Discharge Orders    None       Blanchie Dessert, MD 08/06/2018 1943

## 2018-08-09 NOTE — Progress Notes (Signed)
Pharmacy Antibiotic Note  Loretta Bell is a 78 y.o. female admitted on 07/26/2018 with sepsis from [facility], on trach collar recently on Merrem/vanc for UTI tx and currently on Cefepime at facility.  Pharmacy has been consulted for vancomycin and merrem dosing.  [facility] dosing : Vanc 1g IV every 24 h x 7 days (start 6/23, last dose on 6/29), Merrem 1g IV every 8 h x 7 days (start 6/23, discontinued 6/27) Cefepime 1g IV every 8 h starting 6/27 last dose 6/30 @1400  SCr 1.6 (BL ~1.8-2)  Plan: Vancomycin 1500mg  IV x1, then 1250mg  Iv every 24 hours Merrem 1g IV every 12 hours Monitor renal function, Cx and clinical progression to narrow Vancomycin levels at steady state  Height: 5\' 2"  (157.5 cm) IBW/kg (Calculated) : 50.1  Temp (24hrs), Avg:98.9 F (37.2 C), Min:98.9 F (37.2 C), Max:98.9 F (37.2 C)  Recent Labs  Lab 07/21/2018 1552  WBC 11.3*  CREATININE 1.60*  LATICACIDVEN 1.1    CrCl cannot be calculated (Unknown ideal weight.).    Allergies  Allergen Reactions  . Cefazolin   . Saxagliptin   . Sulfamethoxazole   . Zosyn [Piperacillin Sod-Tazobactam So]     Bertis Ruddy, PharmD Clinical Pharmacist Please check AMION for all [place] numbers 08/04/2018 9:51 PM

## 2018-08-09 NOTE — ED Notes (Signed)
PICC in left upper arm okay to use per MD.

## 2018-08-09 NOTE — ED Notes (Signed)
Report attempted, left call back number

## 2018-08-09 NOTE — ED Triage Notes (Signed)
Pt BIB [facility] for difficulty breathing. Per EMS patient is typically just on a trach collar for oxygen but her oxygen sats were "low" per the facility so [facility]'s physician ordered a ventilator. Pt bagged en route via [facility] and EMS reports an oxygen saturation of 100%. Pt mental status is at baseline per EMS report. Pt has chronic foley, left PICC line, and flexi-seal. Per EMS patient covid test was negative yesterday.

## 2018-08-09 NOTE — Progress Notes (Signed)
Patient transported from ED room 23 to [id number] without complication. Report given to ICU RT.

## 2018-08-09 NOTE — ED Notes (Signed)
Pt cbg 69, pt given 12.5gm dextrose via PICC, repeat 15 min cbg 115. Also, unable to collect [facility] prior continuing abx therapy. RN and phlebotomy tech attempted.

## 2018-08-09 NOTE — ED Notes (Signed)
ED TO INPATIENT HANDOFF REPORT  ED Nurse Name and Phone #: [phone]  S Name/Age/Gender Loretta Bell 78 y.o. female Room/Bed: 023C/023C  Code Status   Code Status: Partial Code  Home/SNF/Other Skilled nursing facility [facility] Patient oriented to: opens eyes Is this baseline? Yes   Triage Complete: Triage complete  Chief Complaint [facility] pt  Triage Note Pt BIB [facility] for difficulty breathing. Per EMS patient is typically just on a trach collar for oxygen but her oxygen sats were "low" per the facility so [facility]'s physician ordered a ventilator. Pt bagged en route via BVM and EMS reports an oxygen saturation of 100%. Pt mental status is at baseline per EMS report. Pt has chronic foley, left PICC line, and flexi-seal. Per EMS patient covid test was negative yesterday.    Allergies Allergies  Allergen Reactions  . Diphenhydramine Hcl Palpitations  . Piperacillin Sod-Tazobactam So Rash    Petechial rash (vasculitis) (documented by ID 03/30/18)  . Sulfamethoxazole-Trimethoprim Rash    Rash started the day after finishing a 10 day course of Bactrim  . Cefazolin   . Saxagliptin   . Sulfamethoxazole     Level of Care/Admitting Diagnosis ED Disposition    ED Disposition Condition Comment   Admit  Hospital Area: [place] [[id number]]  Level of Care: ICU [6]  Covid Evaluation: Confirmed COVID Negative  Diagnosis: Acute respiratory failure ([place]) [518.81.ICD-9-CM]  Admitting Physician: Collier Bullock [[id number]]  Attending Physician: Collier Bullock [[id number]]  Estimated length of stay: 3 - 4 days  Certification:: I certify this patient will need inpatient services for at least 2 midnights  PT Class (Do Not Modify): Inpatient [101]  PT Acc Code (Do Not Modify): Private [1]       B Medical/Surgery History Past Medical History:  Diagnosis Date  . Acute on chronic diastolic heart failure ([place])   . Acute on chronic respiratory failure with hypoxia  ([place])   . Atrial fibrillation ([place])   . Chronic atrial fibrillation   . Chronic kidney disease (CKD)   . Chronic kidney disease, stage IV (severe) ([place])   . Diabetes mellitus ([place])   . Hypertension   . Pulmonary alveolar hemorrhage    Past Surgical History:  Procedure Laterality Date  . ATRIAL ABLATION SURGERY    . EYE SURGERY    . THYROID SURGERY       A IV Location/Drains/Wounds Patient Lines/Drains/Airways Status   Active Line/Drains/Airways    Name:   Placement date:   Placement time:   Site:   Days:   Peripheral IV 07/29/18 Anterior;Left Forearm   07/29/18    1414    Forearm   11   Tracheostomy Portex 7 mm Cuffed   -    -    7 mm             Intake/Output Last 24 hours No intake or output data in the 24 hours ending 07/28/2018 2201  Labs/Imaging Results for orders placed or performed during the hospital encounter of 07/21/2018 (from the past 48 hour(s))  CBC with Differential/Platelet     Status: Abnormal   Collection Time: 08/06/2018  3:52 PM  Result Value Ref Range   WBC 11.3 (H) 4.0 - 10.5 K/uL   RBC 2.76 (L) 3.87 - 5.11 MIL/uL   Hemoglobin 8.2 (L) 12.0 - 15.0 g/dL   HCT 26.6 (L) 36.0 - 46.0 %   MCV 96.4 80.0 - 100.0 fL   MCH 29.7 26.0 - 34.0 pg  MCHC 30.8 30.0 - 36.0 g/dL   RDW 17.7 (H) 11.5 - 15.5 %   Platelets 95 (L) 150 - 400 K/uL    Comment: REPEATED TO VERIFY PLATELET COUNT CONFIRMED BY SMEAR SPECIMEN CHECKED FOR CLOTS Immature Platelet Fraction may be clinically indicated, consider ordering this additional test [id number]    nRBC 0.3 (H) 0.0 - 0.2 %   Neutrophils Relative % 85 %   Neutro Abs 9.6 (H) 1.7 - 7.7 K/uL   Lymphocytes Relative 8 %   Lymphs Abs 0.9 0.7 - 4.0 K/uL   Monocytes Relative 3 %   Monocytes Absolute 0.3 0.1 - 1.0 K/uL   Eosinophils Relative 0 %   Eosinophils Absolute 0.0 0.0 - 0.5 K/uL   Basophils Relative 0 %   Basophils Absolute 0.0 0.0 - 0.1 K/uL   Immature Granulocytes 4 %   Abs Immature Granulocytes 0.48 (H) 0.00 - 0.07  K/uL    Comment: Performed at [facility], [address] [place], [place] [address]  Comprehensive metabolic panel     Status: Abnormal   Collection Time: 07/31/2018  3:52 PM  Result Value Ref Range   Sodium 130 (L) 135 - 145 mmol/L   Potassium 3.7 3.5 - 5.1 mmol/L   Chloride 96 (L) 98 - 111 mmol/L   CO2 25 22 - 32 mmol/L   Glucose, Bld 138 (H) 70 - 99 mg/dL   BUN 87 (H) 8 - 23 mg/dL   Creatinine, Ser 1.60 (H) 0.44 - 1.00 mg/dL   Calcium 8.5 (L) 8.9 - 10.3 mg/dL   Total Protein 5.0 (L) 6.5 - 8.1 g/dL   Albumin 1.9 (L) 3.5 - 5.0 g/dL   AST 30 15 - 41 U/L   ALT 24 0 - 44 U/L   Alkaline Phosphatase 154 (H) 38 - 126 U/L   Total Bilirubin 1.1 0.3 - 1.2 mg/dL   GFR calc non Af Amer 31 (L) >60 mL/min   GFR calc Af Amer 36 (L) >60 mL/min   Anion gap 9 5 - 15    Comment: Performed at [facility], [place] [address], [place], [place] [address]  Protime-INR     Status: None   Collection Time: 07/25/2018  3:52 PM  Result Value Ref Range   Prothrombin Time 14.3 11.4 - 15.2 seconds   INR 1.1 0.8 - 1.2    Comment: (NOTE) INR goal varies based on device and disease states. Performed at [facility], [place] [address] [place], [place] [address]   Brain natriuretic peptide     Status: Abnormal   Collection Time: 08/08/2018  3:52 PM  Result Value Ref Range   B Natriuretic Peptide 4,269.1 (H) 0.0 - 100.0 pg/mL    Comment: Performed at [facility] [address] [place], [place] [address]  Lactic acid, plasma     Status: None   Collection Time: 08/06/2018  3:52 PM  Result Value Ref Range   Lactic Acid, Venous 1.1 0.5 - 1.9 mmol/L    Comment: Performed at [facility] [address] [place], [place] [address]  Urinalysis, Routine w reflex microscopic     Status: Abnormal   Collection Time: 07/25/2018  3:52 PM  Result Value Ref Range   Color, Urine RED (A) YELLOW   APPearance HAZY (A) CLEAR   Specific Gravity, Urine 1.013 1.005 - 1.030   pH 6.0 5.0 - 8.0   Glucose,  UA NEGATIVE NEGATIVE mg/dL  Hgb urine dipstick MODERATE (A) NEGATIVE   Bilirubin Urine NEGATIVE NEGATIVE   Ketones, ur 5 (A) NEGATIVE mg/dL   Protein, ur 100 (A) NEGATIVE mg/dL   Nitrite NEGATIVE NEGATIVE   Leukocytes,Ua MODERATE (A) NEGATIVE   RBC / HPF >50 (H) 0 - 5 RBC/hpf   Bacteria, UA FEW (A) NONE SEEN   Mucus PRESENT     Comment: Performed at [facility] [address] [place], [place] [address]  SARS Coronavirus 2 (CEPHEID - Performed in [facility] hospital lab), Hosp Order     Status: None   Collection Time: 07/14/2018  4:42 PM   Specimen: Nasopharyngeal Swab  Result Value Ref Range   SARS Coronavirus 2 NEGATIVE NEGATIVE    Comment: (NOTE) If result is NEGATIVE SARS-CoV-2 target nucleic acids are NOT DETECTED. The SARS-CoV-2 RNA is generally detectable in upper and lower  respiratory specimens during the acute phase of infection. The lowest  concentration of SARS-CoV-2 viral copies this assay can detect is 250  copies / mL. A negative result does not preclude SARS-CoV-2 infection  and should not be used as the sole basis for treatment or other  patient management decisions.  A negative result may occur with  improper specimen collection / handling, submission of specimen other  than nasopharyngeal swab, presence of viral mutation(s) within the  areas targeted by this assay, and inadequate number of viral copies  (<250 copies / mL). A negative result must be combined with clinical  observations, patient history, and epidemiological information. If result is POSITIVE SARS-CoV-2 target nucleic acids are DETECTED. The SARS-CoV-2 RNA is generally detectable in upper and lower  respiratory specimens dur ing the acute phase of infection.  Positive  results are indicative of active infection with SARS-CoV-2.  Clinical  correlation with patient history and other diagnostic information is  necessary to determine patient infection status.  Positive results do  not rule  out bacterial infection or co-infection with other viruses. If result is PRESUMPTIVE POSTIVE SARS-CoV-2 nucleic acids MAY BE PRESENT.   A presumptive positive result was obtained on the submitted specimen  and confirmed on repeat testing.  While 2019 novel coronavirus  (SARS-CoV-2) nucleic acids may be present in the submitted sample  additional confirmatory testing may be necessary for epidemiological  and / or clinical management purposes  to differentiate between  SARS-CoV-2 and other Sarbecovirus currently known to infect humans.  If clinically indicated additional testing with an alternate test  methodology [phone]) is advised. The SARS-CoV-2 RNA is generally  detectable in upper and lower respiratory sp ecimens during the acute  phase of infection. The expected result is Negative. Fact Sheet for Patients:  StrictlyIdeas.no Fact Sheet for Healthcare Providers: BankingDealers.co.za This test is not yet approved or cleared by the [place] FDA and has been authorized for detection and/or diagnosis of SARS-CoV-2 by FDA under an Emergency Use Authorization (EUA).  This EUA will remain in effect (meaning this test can be used) for the duration of the COVID-19 declaration under Section 564(b)(1) of the Act, 21 U.S.C. section 360bbb-3(b)(1), unless the authorization is terminated or revoked sooner. Performed at [facility], [place] [address] [place], [place] [address]   POCT I-Stat EG7     Status: Abnormal   Collection Time: 07/11/2018  4:58 PM  Result Value Ref Range   pH, Ven 7.355 7.250 - 7.430   pCO2, Ven 46.1 44.0 - 60.0 mmHg   pO2, Ven 125.0 (H) 32.0 - 45.0 mmHg  Bicarbonate 25.8 20.0 - 28.0 mmol/L   TCO2 27 22 - 32 mmol/L   O2 Saturation 99.0 %   Sodium 132 (L) 135 - 145 mmol/L   Potassium 3.7 3.5 - 5.1 mmol/L   Calcium, Ion 1.21 1.15 - 1.40 mmol/L   HCT 25.0 (L) 36.0 - 46.0 %   Hemoglobin 8.5 (L) 12.0 - 15.0 g/dL    Patient temperature HIDE    Sample type VENOUS    Dg Chest Port 1 View  Result Date: 08/01/2018 CLINICAL DATA:  Shortness of breath. EXAM: PORTABLE CHEST 1 VIEW COMPARISON:  07/29/2018 FINDINGS: The tracheostomy tube is stable.  The pacer wires are stable. Persistent cardiac enlargement and tortuous calcified thoracic aorta. Persistent asymmetric vascular congestion and patchy right lung infiltrates. Persistent left lower lobe density likely combination of effusion, atelectasis and infiltrate. IMPRESSION: 1. Stable tracheostomy tube. 2. Stable cardiac enlargement. 3. Persistent bilateral infiltrates and probable left effusion. Electronically Signed   By: Marijo Sanes M.D.   On: 07/28/2018 16:58    Pending Labs Unresulted Labs (From admission, onward)    Start     Ordered   08/10/18 0500  CBC  Tomorrow morning,   R     07/28/2018 2157   08/10/18 9833  Basic metabolic panel  Tomorrow morning,   R     07/24/2018 2157   08/10/18 0500  Magnesium  Tomorrow morning,   R     07/18/2018 2157   08/10/18 0500  Phosphorus  Tomorrow morning,   R     08/07/2018 2157   08/06/2018 2136  Culture, blood (routine x 2)  BLOOD CULTURE X 2,   R (with STAT occurrences)     07/19/2018 2135   08/05/2018 2136  Culture, respiratory (non-expectorated)  Once,   STAT     07/18/2018 2135   07/31/2018 1552  Urine Culture  Once,   STAT     07/25/2018 1552          Vitals/Pain Today's Vitals   07/19/2018 1945 08/08/2018 1955 08/03/2018 2030 08/04/2018 2130  BP: (!) 163/99  (!) 142/82 (!) 145/99  Pulse: 92  78 71  Resp:      Temp:      TempSrc:      SpO2: 100% 100% 97% 99%  Height:        Isolation Precautions No active isolations  Medications Medications  chlorhexidine gluconate (MEDLINE KIT) (PERIDEX) 0.12 % solution 15 mL (has no administration in time range)  MEDLINE mouth rinse (has no administration in time range)  famotidine (PEPCID) IVPB 20 mg premix (has no administration in time range)  insulin aspart (novoLOG)  injection 0-9 Units (has no administration in time range)  fentaNYL (SUBLIMAZE) injection 25-50 mcg (has no administration in time range)  furosemide (LASIX) injection 60 mg (60 mg Intravenous Given 08/02/2018 1852)    Mobility non-ambulatory High fall risk   Focused Assessments Pulmonary Assessment Handoff:  Lung sounds: Bilateral Breath Sounds: Rhonchi, Diminished O2 Device: Ventilator        R Recommendations: See Admitting Provider Note  Report given to:   Additional Notes:  Vent trach

## 2018-08-10 ENCOUNTER — Inpatient Hospital Stay (HOSPITAL_COMMUNITY): Payer: Medicare Other

## 2018-08-10 DIAGNOSIS — I340 Nonrheumatic mitral (valve) insufficiency: Secondary | ICD-10-CM

## 2018-08-10 DIAGNOSIS — I361 Nonrheumatic tricuspid (valve) insufficiency: Secondary | ICD-10-CM

## 2018-08-10 LAB — GLUCOSE, CAPILLARY
Glucose-Capillary: 111 mg/dL — ABNORMAL HIGH (ref 70–99)
Glucose-Capillary: 116 mg/dL — ABNORMAL HIGH (ref 70–99)
Glucose-Capillary: 121 mg/dL — ABNORMAL HIGH (ref 70–99)
Glucose-Capillary: 151 mg/dL — ABNORMAL HIGH (ref 70–99)
Glucose-Capillary: 163 mg/dL — ABNORMAL HIGH (ref 70–99)
Glucose-Capillary: 231 mg/dL — ABNORMAL HIGH (ref 70–99)
Glucose-Capillary: 54 mg/dL — ABNORMAL LOW (ref 70–99)
Glucose-Capillary: 59 mg/dL — ABNORMAL LOW (ref 70–99)
Glucose-Capillary: 59 mg/dL — ABNORMAL LOW (ref 70–99)
Glucose-Capillary: 61 mg/dL — ABNORMAL LOW (ref 70–99)
Glucose-Capillary: 76 mg/dL (ref 70–99)

## 2018-08-10 LAB — BASIC METABOLIC PANEL
Anion gap: 12 (ref 5–15)
BUN: 86 mg/dL — ABNORMAL HIGH (ref 8–23)
CO2: 27 mmol/L (ref 22–32)
Calcium: 8.5 mg/dL — ABNORMAL LOW (ref 8.9–10.3)
Chloride: 95 mmol/L — ABNORMAL LOW (ref 98–111)
Creatinine, Ser: 1.51 mg/dL — ABNORMAL HIGH (ref 0.44–1.00)
GFR calc Af Amer: 39 mL/min — ABNORMAL LOW (ref >60)
GFR calc non Af Amer: 33 mL/min — ABNORMAL LOW (ref >60)
Glucose, Bld: 80 mg/dL (ref 70–99)
Potassium: 3.4 mmol/L — ABNORMAL LOW (ref 3.5–5.1)
Sodium: 134 mmol/L — ABNORMAL LOW (ref 135–145)

## 2018-08-10 LAB — CBC
HCT: 25.9 % — ABNORMAL LOW (ref 36.0–46.0)
Hemoglobin: 8.2 g/dL — ABNORMAL LOW (ref 12.0–15.0)
MCH: 30 pg (ref 26.0–34.0)
MCHC: 31.7 g/dL (ref 30.0–36.0)
MCV: 94.9 fL (ref 80.0–100.0)
Platelets: 81 10*3/uL — ABNORMAL LOW (ref 150–400)
RBC: 2.73 MIL/uL — ABNORMAL LOW (ref 3.87–5.11)
RDW: 17.5 % — ABNORMAL HIGH (ref 11.5–15.5)
WBC: 11.6 10*3/uL — ABNORMAL HIGH (ref 4.0–10.5)
nRBC: 0 % (ref 0.0–0.2)

## 2018-08-10 LAB — ECHOCARDIOGRAM COMPLETE
Height: 62 in
Weight: 3746.06 oz

## 2018-08-10 LAB — MAGNESIUM
Magnesium: 2.4 mg/dL (ref 1.7–2.4)
Magnesium: 2.4 mg/dL (ref 1.7–2.4)

## 2018-08-10 LAB — PHOSPHORUS
Phosphorus: 3.4 mg/dL (ref 2.5–4.6)
Phosphorus: 3.7 mg/dL (ref 2.5–4.6)

## 2018-08-10 LAB — MRSA PCR SCREENING: MRSA by PCR: NEGATIVE

## 2018-08-10 LAB — LACTATE DEHYDROGENASE: LDH: 243 U/L — ABNORMAL HIGH (ref 98–192)

## 2018-08-10 LAB — SAVE SMEAR(SSMR), FOR PROVIDER SLIDE REVIEW

## 2018-08-10 LAB — TECHNOLOGIST SMEAR REVIEW

## 2018-08-10 MED ORDER — DEXTROSE 50 % IV SOLN
INTRAVENOUS | Status: AC
  Administered 2018-08-10: 50 mL
  Filled 2018-08-10: qty 50

## 2018-08-10 MED ORDER — POLYETHYLENE GLYCOL 3350 17 G PO PACK
17.0000 g | PACK | Freq: Every day | ORAL | Status: DC
  Administered 2018-08-11 – 2018-08-15 (×5): 17 g
  Filled 2018-08-10 (×6): qty 1

## 2018-08-10 MED ORDER — FOLIC ACID 1 MG PO TABS
0.5000 mg | ORAL_TABLET | Freq: Every day | ORAL | Status: DC
  Administered 2018-08-10 – 2018-08-17 (×8): 0.5 mg
  Filled 2018-08-10 (×10): qty 1

## 2018-08-10 MED ORDER — CARVEDILOL 12.5 MG PO TABS
12.5000 mg | ORAL_TABLET | Freq: Two times a day (BID) | ORAL | Status: DC
  Administered 2018-08-10 – 2018-08-17 (×14): 12.5 mg via ORAL
  Filled 2018-08-10 (×15): qty 1

## 2018-08-10 MED ORDER — HYDRALAZINE HCL 25 MG PO TABS
25.0000 mg | ORAL_TABLET | Freq: Two times a day (BID) | ORAL | Status: DC
  Administered 2018-08-10 – 2018-08-11 (×4): 25 mg via ORAL
  Filled 2018-08-10 (×5): qty 1

## 2018-08-10 MED ORDER — DILTIAZEM 12 MG/ML ORAL SUSPENSION
30.0000 mg | Freq: Three times a day (TID) | ORAL | Status: DC
  Administered 2018-08-10 – 2018-08-11 (×4): 30 mg via ORAL
  Filled 2018-08-10 (×7): qty 3

## 2018-08-10 MED ORDER — VITAMIN D 25 MCG (1000 UNIT) PO TABS
1000.0000 [IU] | ORAL_TABLET | Freq: Every day | ORAL | Status: DC
  Administered 2018-08-10 – 2018-08-17 (×8): 1000 [IU]
  Filled 2018-08-10 (×8): qty 1

## 2018-08-10 MED ORDER — POTASSIUM CHLORIDE CRYS ER 20 MEQ PO TBCR
40.0000 meq | EXTENDED_RELEASE_TABLET | Freq: Once | ORAL | Status: AC
  Administered 2018-08-10: 13:00:00 40 meq via ORAL
  Filled 2018-08-10: qty 2

## 2018-08-10 MED ORDER — LORATADINE 10 MG PO TABS
10.0000 mg | ORAL_TABLET | Freq: Every day | ORAL | Status: DC
  Administered 2018-08-10 – 2018-08-17 (×8): 10 mg
  Filled 2018-08-10 (×8): qty 1

## 2018-08-10 MED ORDER — DEXTROSE 50 % IV SOLN
INTRAVENOUS | Status: AC
  Filled 2018-08-10: qty 50

## 2018-08-10 MED ORDER — MODAFINIL 100 MG PO TABS
100.0000 mg | ORAL_TABLET | Freq: Every day | ORAL | Status: DC
  Administered 2018-08-11 – 2018-08-17 (×7): 100 mg
  Filled 2018-08-10 (×8): qty 1

## 2018-08-10 MED ORDER — CITALOPRAM HYDROBROMIDE 10 MG PO TABS
10.0000 mg | ORAL_TABLET | Freq: Every day | ORAL | Status: DC
  Administered 2018-08-10 – 2018-08-17 (×8): 10 mg
  Filled 2018-08-10 (×8): qty 1

## 2018-08-10 MED ORDER — PRO-STAT SUGAR FREE PO LIQD
30.0000 mL | Freq: Three times a day (TID) | ORAL | Status: DC
  Administered 2018-08-10 – 2018-08-17 (×21): 30 mL
  Filled 2018-08-10 (×21): qty 30

## 2018-08-10 MED ORDER — DEXTROSE 50 % IV SOLN
12.5000 g | Freq: Once | INTRAVENOUS | Status: AC
  Administered 2018-08-10: 04:00:00 12.5 g via INTRAVENOUS

## 2018-08-10 MED ORDER — LATANOPROST 0.005 % OP SOLN
1.0000 [drp] | Freq: Every day | OPHTHALMIC | Status: DC
  Administered 2018-08-10 – 2018-08-16 (×7): 1 [drp] via OPHTHALMIC
  Filled 2018-08-10: qty 2.5

## 2018-08-10 MED ORDER — AMANTADINE HCL 100 MG PO CAPS
100.0000 mg | ORAL_CAPSULE | Freq: Every day | ORAL | Status: DC
  Administered 2018-08-10 – 2018-08-16 (×6): 100 mg
  Filled 2018-08-10 (×8): qty 1

## 2018-08-10 MED ORDER — FUROSEMIDE 10 MG/ML IJ SOLN
60.0000 mg | Freq: Once | INTRAMUSCULAR | Status: AC
  Administered 2018-08-10: 60 mg via INTRAVENOUS
  Filled 2018-08-10: qty 6

## 2018-08-10 MED ORDER — CLONAZEPAM 0.125 MG PO TBDP
0.2500 mg | ORAL_TABLET | Freq: Two times a day (BID) | ORAL | Status: DC
  Administered 2018-08-10 – 2018-08-17 (×13): 0.25 mg
  Filled 2018-08-10 (×14): qty 2

## 2018-08-10 MED ORDER — TIMOLOL MALEATE 0.5 % OP SOLN
1.0000 [drp] | Freq: Two times a day (BID) | OPHTHALMIC | Status: DC
  Administered 2018-08-10 – 2018-08-17 (×15): 1 [drp] via OPHTHALMIC
  Filled 2018-08-10: qty 5

## 2018-08-10 MED ORDER — QUETIAPINE FUMARATE 50 MG PO TABS
50.0000 mg | ORAL_TABLET | Freq: Two times a day (BID) | ORAL | Status: DC
  Administered 2018-08-10 – 2018-08-17 (×13): 50 mg
  Filled 2018-08-10 (×13): qty 1

## 2018-08-10 MED ORDER — CHLORHEXIDINE GLUCONATE 0.12 % MT SOLN
5.0000 mL | Freq: Two times a day (BID) | OROMUCOSAL | Status: DC
  Administered 2018-08-10: 13:00:00 via OROMUCOSAL
  Administered 2018-08-10 – 2018-08-11 (×2): 5 mL via OROMUCOSAL
  Filled 2018-08-10 (×5): qty 15

## 2018-08-10 MED ORDER — CHLORHEXIDINE GLUCONATE CLOTH 2 % EX PADS
6.0000 | MEDICATED_PAD | Freq: Every day | CUTANEOUS | Status: DC
  Administered 2018-08-11 – 2018-08-17 (×8): 6 via TOPICAL

## 2018-08-10 MED ORDER — OXYCODONE HCL 5 MG PO TABS
5.0000 mg | ORAL_TABLET | Freq: Four times a day (QID) | ORAL | Status: DC | PRN
  Administered 2018-08-12 – 2018-08-14 (×4): 5 mg
  Filled 2018-08-10 (×4): qty 1

## 2018-08-10 MED ORDER — DEXTROSE 10 % IV SOLN
INTRAVENOUS | Status: DC
  Administered 2018-08-10: 1000 mL via INTRAVENOUS

## 2018-08-10 MED ORDER — OSMOLITE 1.2 CAL PO LIQD
1000.0000 mL | ORAL | Status: DC
  Administered 2018-08-10 – 2018-08-17 (×6): 1000 mL
  Filled 2018-08-10 (×13): qty 1000

## 2018-08-10 NOTE — Progress Notes (Signed)
eLink Physician-Brief Progress Note Patient Name: Loretta Bell DOB: August 13, 1940 MRN: [id number]   Date of Service  08/10/2018  HPI/Events of Note  Pt admitted from [facility] with pulmonary edema and acute on chronic respiratory failure.   eICU Interventions  New pt evaluation completed        Frederik Pear 08/10/2018, 12:45 AM

## 2018-08-10 NOTE — Progress Notes (Addendum)
NAME:  Loretta Bell, MRN:  [id number], DOB:  Feb 22, 1940, LOS: 1 ADMISSION DATE:  07/29/2018, CONSULTATION DATE:  08/08/18 REFERRING MD:  ED, CHIEF COMPLAINT:  hypoxemia  Brief History   Loretta Bell is a 78 year old woman admitted on 6/30 from [place] ([facility]) with acute on chronic hypoxemic respiratory failure, UTI, bacteremia.  Baseline mental status - opens eyes to sound.    History of present illness   Weaned off vent in June.  Recently seen in ER (07/29/18) for hypothermia, found to have UTI.  Started on cipro, changed to mero due to klebsiella.  Vanc recommended due to enterococcus in blood culture. Noted to have desats on 6/30 and worsening anasarca.  Past Medical History  chronic respiratory failure (cause unk to me) (on trach collar at [facility]), atrial fibrillation, CKD Stage IV, DM, HTN, HFpEF, reported hx of pulmonary hemorrhage, UTIs, OSA Hx aortic valve endocarditis (MSSA) Allergic to Cefazolin, Saxagliptin, Sulfamethoxazole, and Zosyn [piperacillin sod-tazobactam so]  Significant Hospital Events   6/30 admitted with hypoxemia, requiring vent  Consults:  None  Procedures:  None  Significant Diagnostic Tests:  CXR 6/30 - persistent b/l infiltrates and probable left effusion  Micro Data:  Urine culture 6/30  Blood culture 6/30  Antimicrobials:  Meropenem Started PTA (6/23?) >>  Vancomycin  7/1 >> Interim history/subjective:  Had elevated BP overnight, home BP meds restarted.  S/P IV Lasix 60mg  x1.  This AM appears comfortable.  Objective   Blood pressure (!) 151/86, pulse 91, temperature (!) 96.7 F (35.9 C), temperature source Axillary, resp. rate 16, height 5\' 2"  (1.575 m), weight 106.2 kg, SpO2 100 %.    Vent Mode: PSV;CPAP FiO2 (%):  [40 %-60 %] 40 % Set Rate:  [16 bmp] 16 bmp Vt Set:  [400 mL-450 mL] 400 mL PEEP:  [5 cmH20] 5 cmH20 Pressure Support:  [8 cmH20] 8 cmH20 Plateau Pressure:  [16 cmH20-19 cmH20] 16 cmH20   Intake/Output Summary  (Last 24 hours) at 08/10/2018 0831 Last data filed at 08/10/2018 0600 Gross per 24 hour  Intake 603.59 ml  Output 1300 ml  Net -696.41 ml   Filed Weights   08/10/18 0500  Weight: 106.2 kg    Physical Exam:  General: 78 y.o. female in NAD HEENT: trach in place Cardio: irregularly irregular Lungs: coarse breath sounds throughout all lung fields, no increased WOB, on 5L per trach Abdomen: Soft, non-tender to palpation, G tube in place Skin: warm and dry, diffuse petechiae and ecchymosis Extremities: Diffuse edema with 3+ pitting edema from BLE to lower abdomen with 3+ pitting edema BUE Neuro: opens eyes to verbal stimulus, non-verbal   Resolved Hospital Problem list     Assessment & Plan:   Hypoxemia: acute on chronic resp failure, recently weaned off vent this month (trach collar at [facility]).   Improving 2/2 fluid overload in setting of acute on chronic HFpEF, anasarca on exam Echo today S/p lasix 60mg  IV with good UOP, will repeat Lasix 60mg  IV Once Collect sputum culture  Trial off vent today, of note, patient is DNR and wishes are to note be placed on vent per chart  Baseline AMS: only opens eyes to voice , not follwing simple commands.  Amantadine100mg  daily Quetiapine 50mg   Celexa 10mg   klonipin 0.5mg   Melatonin  oxycodone for pain 63mq qhs Holding meds, can restart today  Pulmonary hemorrhage - reported at OSH, prior hospitalization 6/20.  Source not mentioned.   UTI and Enterococcus Bacteremia- on mero at OSH reportedly.  Cont vancomycin for enterococcus Cont meropenem for UTI   Hyponatremia: Improving, 132>134 Hypervolemic, anasarca  Hypokalemia Mag WNL Replete  Anemia, normocytic Stable Monitor CBC  Thrombocytopenia  95>81 Smear without schistocytes per tech, pathologist review pending [place] 2/2 medications (antibiotics) Check LDH, haptoglbin for hemolysis.   Hypoalbuminemia  Likely 2/2 protein calorie malnutrition Could be contributing to  anasarca   CKD: Improving, Cr 1.6>1.51  Atrial Fibrillation hold eliquis, given anemia, thrombocytopenia and petechiae/eccymoses.   DM  Hypoglycemia to 54 at 0737, improved to 116 with amp D50 CBGs q4h while NPO, plan to resume G tube feeds today sSSI  HTN Continue coreg, hydral, cardizem  HLD Holding home lipitor gemofirozil  Hx aortic valve endocarditis (MSSA)  Glaucoma Restart home Xalantan 0.005%, timolol maleate 0.5%    Best practice:  Diet: NPO, G tube in place, consult nutrition for diet recommendations Pain/Anxiety/Delirium protocol (if indicated): NAD VAP protocol (if indicated): NA DVT prophylaxis: SCDs given thrombocytpenia, petechiae, ecchymosis  GI prophylaxis: pepcid Glucose control: sSSI Mobility: bedbound Code Status: DNR Family Communication: will reach out to husband today for update Disposition: ICU, consider transfer     ATTESTATION AND SIGNATURE:   The patient Loretta Bell is critically ill with multiple organ systems failure and requires high complexity decision making for assessment and support, frequent evaluation and titration of therapies, application of advanced monitoring technologies and extensive interpretation of multiple databases.    Critical Care Time devoted to patient care services, exclusive of separately billable procedures, described in this note is 35 minutes.   [place] Constable, D.O.  PGY-2 Family Medicine  08/10/2018 8:35 AM

## 2018-08-10 NOTE — Progress Notes (Addendum)
Code Status Clarification  Spoke with patient's husband Bailee Metter.  Clarified code status.  Patient may be ventilated if needed, but he does not want chest compressions or cardioversion.  Will update the record.  [place] Constable, D.O.  PGY-2 Family Medicine  08/10/2018 12:13 PM

## 2018-08-10 NOTE — Progress Notes (Signed)
 Initial Nutrition Assessment  DOCUMENTATION CODES:   Morbid obesity  INTERVENTION:   Tube Feeding:  Osmolite 1.2 at 60 ml/hr Pro-Stat 30 mL TID Provides 125 g of protein, 2028 kcals, 1166 mL of free water Meets 100% estimated calorie and protein needs  Recommend checking Vitamin C and Zinc levels   NUTRITION DIAGNOSIS:   Inadequate oral intake related to acute illness as evidenced by NPO status.  GOAL:   Provide needs based on ASPEN/SCCM guidelines  MONITOR:   TF tolerance, Vent status, Labs, Weight trends, Skin  REASON FOR ASSESSMENT:   Consult Enteral/tube feeding initiation and management  ASSESSMENT:   78 yo female admitted with acute on chronic respiratory failure likely secondary to pulmonary edema in setting of acute on chronic CHF. Pt with chronic trach, G-tube, CKD, CHF, DM, HTN  Pt currently trach collar via trach, not requiring vent support at this time  G-tube in place; no TF prescription noted in outpatient chart. Pt unable to communicate, opens eyes/aert but unable answer RD questions; unsure if pt takes anything by mouth at baseline  Pt with significant ecchymosis/petichiae all over her body (face, b/l thigh, b/l arms, scapula/upper back region, chest, abdomen). Pt with unstageable PI.  Pt is bed bound per report. Recommend checking Vitamin C level, maybe Zinc level  Noted pt with hypoglycemic episodes, started on D10 at 30 ml/hr; should improve with initiation of TF   Current wt 106.2 kg; no other weight encounters available. Unsure pt pt dry weight as pt with significant anasarca/edema present on exam  Labs: CBGs 59-116, Creatinine 1.51, BUN 86 Meds: cholecalciferol, D10 at 30 ml/hr, lasix x 1 dose, folic acid, KCl  NUTRITION - FOCUSED PHYSICAL EXAM:  Pt with significant edema; unable to assess extremeties due to edema    Most Recent Value  Orbital Region  No depletion  Upper Arm Region  Unable to assess  Thoracic and Lumbar Region  No  depletion  Buccal Region  Unable to assess  Temple Region  Mild depletion  Clavicle Bone Region  Mild depletion  Clavicle and Acromion Bone Region  Mild depletion  Scapular Bone Region  Mild depletion  Dorsal Hand  Unable to assess  Patellar Region  Unable to assess  Anterior Thigh Region  Unable to assess  Posterior Calf Region  Unable to assess  Edema (RD Assessment)  Severe       Diet Order:   Diet Order            Diet NPO time specified  Diet effective now              EDUCATION NEEDS:   Not appropriate for education at this time  Skin:  Skin Assessment: Skin Integrity Issues: Skin Integrity Issues:: Unstageable, Other (Comment) Unstageable: anus Other: significant ecchymosis/petichiae face, abdomen, chest, b/l legs, b/l arms, back  Last BM:  7/1 rectal tube  Height:   Ht Readings from Last 1 Encounters:  08/02/2018 5\' 2"  (1.575 m)    Weight:   Wt Readings from Last 1 Encounters:  08/10/18 106.2 kg    Ideal Body Weight:  50 kg  BMI:  Body mass index is 42.82 kg/m.  Estimated Nutritional Needs:   Kcal:  1900-2100 kcals  Protein:  100-125 g  Fluid:  >/= 1.5 L    Vici Novick MS, RDN, LDN, CNSC [phone] Pager  [phone] Weekend/On-Call Pager

## 2018-08-10 NOTE — Progress Notes (Signed)
Pt on trach collar right now, ventilator not in use, tolerating well at this time

## 2018-08-10 NOTE — Progress Notes (Signed)
eLink Physician-Brief Progress Note Patient Name: Loretta Bell DOB: [birth year] MRN: [id number]   Date of Service  08/10/2018  HPI/Events of Note  Pt blood pressure trending upward  eICU Interventions  Resume home medications including Coreg, Hydralazine and Cardizem        Frederik Pear 08/10/2018, 2:47 AM

## 2018-08-10 NOTE — Consult Note (Signed)
NAME:  Loretta Bell, MRN:  [id number], DOB:  August 17, 1940, LOS: 1 ADMISSION DATE:  07/24/2018, CONSULTATION DATE:  08/08/18 REFERRING MD:  ED, CHIEF COMPLAINT:  hypoxemia  Brief History   Loretta Bell is a 78 year old woman with a hx of chronic respiratory failure (cause unk to me) (on trach collar at [facility]), atrial fibrillation, hx aortic valve endocarditis, CKD Stage IV, DM, HTN, CHF, reported hx of pulmonary hemorrhage, UTIs.  Sent to hospital from [facility] due to acute low oxygen saturation x 1 day.  Was placed back on vent and sent to ED, sat improved. Staff reportedly noted increasing pitting edema in BUE and BLE, anasarca.  "worsening renal function" also noted.   Baseline mental status - opens eyes to sound.    History of present illness   Recently seen in ER (07/29/18) for hypothermia, found to have UTI.  Started on cipro, changed to mero due to klebsiella.  Vanc recommended due to enterococcus in blood culture.   Past Medical History  chronic respiratory failure (cause unk to me) (on trach collar at [facility]), atrial fibrillation, CKD Stage IV, DM, HTN, HFpEF, reported hx of pulmonary hemorrhage, UTIs.  Hx aortic valve endocarditis (MSSA) Allergic to Cefazolin, Saxagliptin, Sulfamethoxazole, and Zosyn [piperacillin sod-tazobactam so]  Significant Hospital Events     Consults:    Procedures:    Significant Diagnostic Tests:    Micro Data:  Urine culture 6/30  Blood culture 6/30 Antimicrobials:   Meropenem Started PTA (6/23?) Vancomycin  08/10/18 Interim history/subjective:    Objective   Blood pressure (!) 162/114, pulse 83, temperature 98.9 F (37.2 C), temperature source Oral, resp. rate 16, height 5\' 2"  (1.575 m), SpO2 100 %.    Vent Mode: PRVC FiO2 (%):  [40 %-60 %] 40 % Set Rate:  [16 bmp] 16 bmp Vt Set:  [400 mL-450 mL] 400 mL PEEP:  [5 cmH20] 5 cmH20 Plateau Pressure:  [16 cmH20-19 cmH20] 16 cmH20   Intake/Output Summary (Last 24 hours) at  08/10/2018 0324 Last data filed at 08/10/2018 0200 Gross per 24 hour  Intake 401.02 ml  Output 1000 ml  Net -598.98 ml   There were no vitals filed for this visit.  Examination: General: Obese, minimally responsive initially, grimaces, opens eyes HENT: petechiae, eccymotic areas of confluence over L eye and nose.  MM dry.  Lungs: Diminished bs at bases. No crackles anteriorly.  Cardiovascular: RRR no mgr  Abdomen: hyperactive bowel sounds, non tender, obese. Pitting edema on lower abdomen Extremities: profound pitting edema in all 4 extremites, petechiae and eccymoses.  Neuro: opens eyes to voice, does not seem to consistently follow commands (flickers fingers when asked to squeeze hand, but doing this intermittently) GU: foley in place, light red urine  Resolved Hospital Problem list     Assessment & Plan:  Hypoxemia: chronic resp failure, recently weaned off vent this month.  Discharged on 6/19 on trach collar to [facility].  HFpEF B pulm edema and effusions appear stable.  Trial of diuresis.   Cont antibiotics, on for UTI and bacteremia.  Sputum cx pending.   Cont mech vent, trial off once diuresed.    Baseline AMS: only opens eyes to voice , not follwing simple commands.  Amantadine100mg  daily? Quetiapine 50mg   Celexa 10mg   klonipin 0.5mg   Melatonin  oxycodone for pain 67mq  qht Meds held for now.    pulmonary hemorrhage - reported at OSH., prior hospitalization 6/20.  Source not mentioned.   UTI - on mero  at OSH reportedly.  Klebsiella from prior Ucx.  Per notes was on cefepime.  Now on meropenem.  On vanc for enterococcus on blood cultures   hyponatramia: 132 (near baseline) Hypervolemic, anasarca  Anemia, normocytic  Thrombocytopenia -  PT INR Normal [place] 2/2 medications (antibiotics), vs destruction - check peripheral smear, eval for schistocytes. Does not appear to be DIC.  Check labs for hemolysis.   Hypoalbuminemia - likely contributing to anasarca.  Nutrition related?   CKD: Cr. 1.6 down from 1.8   atrial fibrillation - hold eliquis, given anemia, thrombocytopenia and petechiae/eccymoses.   DM - ISS  HTN: coreg, dilt, hydralazine, modafinil  At home CNO:[id number], gemfirozil  At home  Hx aortic valve endocarditis (MSSA)  Xalantan 0.005%, timolol maleate 0.5%    Best practice:  Diet: npo  Pain/Anxiety/Delirium protocol (if indicated): NAD VAP protocol (if indicated):  DVT prophylaxis: held due to thrombocytopenia and eccymoses, consider starting if HB stable.   GI prophylaxis: pepcid Glucose control: ISS Mobility: bedbound Code Status: DNR Family Communication:  Disposition: ICU  Labs   CBC: Recent Labs  Lab 07/12/2018 1552 07/22/2018 1658  WBC 11.3*  --   NEUTROABS 9.6*  --   HGB 8.2* 8.5*  HCT 26.6* 25.0*  MCV 96.4  --   PLT 95*  --     Basic Metabolic Panel: Recent Labs  Lab 07/31/2018 1552 07/16/2018 1658  NA 130* 132*  K 3.7 3.7  CL 96*  --   CO2 25  --   GLUCOSE 138*  --   BUN 87*  --   CREATININE 1.60*  --   CALCIUM 8.5*  --    GFR: CrCl cannot be calculated (Unknown ideal weight.). Recent Labs  Lab 07/29/2018 1552  WBC 11.3*  LATICACIDVEN 1.1    Liver Function Tests: Recent Labs  Lab 07/23/2018 1552  AST 30  ALT 24  ALKPHOS 154*  BILITOT 1.1  PROT 5.0*  ALBUMIN 1.9*   No results for input(s): LIPASE, AMYLASE in the last 168 hours. No results for input(s): AMMONIA in the last 168 hours.  ABG    Component Value Date/Time   PHART 7.512 (H) 07/01/2018 1755   PCO2ART 38.8 07/01/2018 1755   PO2ART 71.0 (L) 07/01/2018 1755   HCO3 25.8 07/18/2018 1658   TCO2 27 07/19/2018 1658   O2SAT 99.0 07/25/2018 1658     Coagulation Profile: Recent Labs  Lab 08/08/2018 1552  INR 1.1    Cardiac Enzymes: No results for input(s): CKTOTAL, CKMB, CKMBINDEX, TROPONINI in the last 168 hours.  HbA1C: Hgb A1c MFr Bld  Date/Time Value Ref Range Status  07/04/2018 07:46 AM 6.8 (H) 4.8 - 5.6 % Final     Comment:    (NOTE)         Prediabetes: 5.7 - 6.4         Diabetes: >6.4         Glycemic control for adults with diabetes: <7.0     CBG: Recent Labs  Lab 07/19/2018 2255 08/04/2018 2326 08/10/18 0005  GLUCAP 69* 115* 54    Review of Systems:   Unable to assess  Past Medical History  Loretta Bell,  has a past medical history of Acute on chronic diastolic heart failure ([place]), Acute on chronic respiratory failure with hypoxia (HCC), Atrial fibrillation (HCC), Chronic atrial fibrillation, Chronic kidney disease ([facility]), Chronic kidney disease, stage IV (severe) ([place]), Diabetes mellitus ([place]), Hypertension, and Pulmonary alveolar hemorrhage.   Surgical History    Past Surgical History:  Procedure Laterality Date  . ATRIAL ABLATION SURGERY    . EYE SURGERY    . THYROID SURGERY       Social History   reports that Loretta Bell has never smoked. Loretta Bell has never used smokeless tobacco. Loretta Bell reports that Loretta Bell does not drink alcohol or use drugs.   Family History   Her family history is not on file.   Allergies Allergies  Allergen Reactions  . Diphenhydramine Hcl Palpitations  . Piperacillin Sod-Tazobactam So Rash    Petechial rash (vasculitis) (documented by ID 03/30/18)  . Sulfamethoxazole-Trimethoprim Rash    Rash started the day after finishing a 10 day course of Bactrim  . Cefazolin   . Saxagliptin   . Sulfamethoxazole      Home Medications  Prior to Admission medications   Medication Sig Start Date End Date Taking? Authorizing Provider  amantadine (SYMMETREL) 100 MG capsule Place 100 mg into feeding tube daily.   Yes [provider]  apixaban (ELIQUIS) 2.5 MG TABS tablet Place 2.5 mg into feeding tube 2 (two) times daily.    Yes [provider]  atorvastatin (LIPITOR) 10 MG tablet Place 10 mg into feeding tube daily.   Yes [provider]  carvedilol (COREG) 12.5 MG tablet Place 12.5 mg into feeding tube 2 (two) times daily with a meal.   Yes [provider]  ceFEPIme (MAXIPIME) 1 g injection Inject 1 g into the vein every 8 (eight) hours. For 7 days 08/06/18  Yes [provider]  chlorhexidine (PERIDEX) 0.12 % solution Use as directed 5 mLs in the mouth or throat 2 (two) times daily.   Yes [provider]  cholecalciferol (VITAMIN D3) 25 MCG (1000 UT) tablet Place 1,000 Units into feeding tube daily.   Yes [provider]  citalopram (CELEXA) 10 MG tablet Place 10 mg into feeding tube daily.   Yes [provider]  clonazePAM (KLONOPIN) 0.25 MG disintegrating tablet Place 0.25 mg into feeding tube 2 (two) times daily.   Yes [provider]  diltiazem (CARDIZEM) 60 MG tablet Place 60 mg into feeding tube every 6 (six) hours as needed.   Yes [provider]  folic acid (FOLVITE) 1 MG tablet Place 0.5 mg into feeding tube daily.   Yes [provider]  gemfibrozil (LOPID) 600 MG tablet Place 600 mg into feeding tube daily.   Yes [provider]  hydrALAZINE (APRESOLINE) 25 MG tablet Place 25 mg into feeding tube 2 (two) times a day.   Yes [provider]  insulin aspart (NOVOLOG) 100 UNIT/ML injection Inject 0-10 Units into the skin 3 (three) times daily before meals. Sliding Scale: IF BS is < 60 or >400 notify MD; 0-150=0u;151-200=2;201-250=4u;251-300=6u;301-350=8u;351-400=10u   Yes [provider]  Insulin Glargine (LANTUS SOLOSTAR) 100 UNIT/ML Solostar Pen Inject 15 Units into the skin daily.   Yes [provider]  latanoprost (XALATAN) 0.005 % ophthalmic solution Place 1 drop into both eyes at bedtime.   Yes [provider]  loratadine (CLARITIN) 10 MG tablet Place 10 mg into feeding tube daily.   Yes [provider]  Melatonin 3 MG TABS Place 3 mg into feeding tube at bedtime.   Yes [provider]  modafinil (PROVIGIL) 100 MG tablet Place 100 mg into feeding tube daily.   Yes [provider]  Multiple  Vitamins-Minerals (MULTIVITAMINS THER. W/MINERALS) TABS tablet 1 tablet daily. Per G-tube   Yes [provider]  oxyCODONE (OXY IR/ROXICODONE) 5 MG immediate  release tablet Place 5 mg into feeding tube every 6 (six) hours as needed for severe pain.   Yes [provider]  Pantoprazole Sodium (PROTONIX PO) Place 40 mg into feeding tube 2 (two) times daily. Granules packet   Yes [provider]  polyethylene glycol (MIRALAX / GLYCOLAX) 17 g packet Place 17 g into feeding tube daily.   Yes [provider]  predniSONE (DELTASONE) 20 MG tablet Place 20 mg into feeding tube daily with breakfast.   Yes [provider]  QUEtiapine (SEROQUEL) 50 MG tablet Place 50 mg into feeding tube 2 (two) times daily.   Yes [provider]  Timolol Maleate 0.5 % (DAILY) SOLN Place 1 drop into both eyes 2 (two) times a day.   Yes [provider]     Critical care time: 45 minutes

## 2018-08-10 NOTE — Progress Notes (Signed)
Pt placed on Trach collar at 0822 for trial. Pt looks comfortable at this time, SpO2 100%. Will continue to monitor.

## 2018-08-10 NOTE — Progress Notes (Signed)
Patient currently on 5L 28% trach collar, vital signs are stable . Ventilator not needed at this time. Will continue to monitor patient.

## 2018-08-10 NOTE — Progress Notes (Signed)
  Echocardiogram 2D Echocardiogram has been performed.  Loretta Bell 08/10/2018, 9:36 AM

## 2018-08-10 DEATH — deceased

## 2018-08-11 LAB — CBC
HCT: 26.6 % — ABNORMAL LOW (ref 36.0–46.0)
Hemoglobin: 8.3 g/dL — ABNORMAL LOW (ref 12.0–15.0)
MCH: 30.1 pg (ref 26.0–34.0)
MCHC: 31.2 g/dL (ref 30.0–36.0)
MCV: 96.4 fL (ref 80.0–100.0)
Platelets: 88 10*3/uL — ABNORMAL LOW (ref 150–400)
RBC: 2.76 MIL/uL — ABNORMAL LOW (ref 3.87–5.11)
RDW: 17.7 % — ABNORMAL HIGH (ref 11.5–15.5)
WBC: 13.7 10*3/uL — ABNORMAL HIGH (ref 4.0–10.5)
nRBC: 0.1 % (ref 0.0–0.2)

## 2018-08-11 LAB — BASIC METABOLIC PANEL
Anion gap: 7 (ref 5–15)
BUN: 88 mg/dL — ABNORMAL HIGH (ref 8–23)
CO2: 27 mmol/L (ref 22–32)
Calcium: 8.2 mg/dL — ABNORMAL LOW (ref 8.9–10.3)
Chloride: 96 mmol/L — ABNORMAL LOW (ref 98–111)
Creatinine, Ser: 1.49 mg/dL — ABNORMAL HIGH (ref 0.44–1.00)
GFR calc Af Amer: 39 mL/min — ABNORMAL LOW (ref >60)
GFR calc non Af Amer: 34 mL/min — ABNORMAL LOW (ref >60)
Glucose, Bld: 281 mg/dL — ABNORMAL HIGH (ref 70–99)
Potassium: 3.6 mmol/L (ref 3.5–5.1)
Sodium: 130 mmol/L — ABNORMAL LOW (ref 135–145)

## 2018-08-11 LAB — GLUCOSE, CAPILLARY
Glucose-Capillary: 241 mg/dL — ABNORMAL HIGH (ref 70–99)
Glucose-Capillary: 276 mg/dL — ABNORMAL HIGH (ref 70–99)
Glucose-Capillary: 292 mg/dL — ABNORMAL HIGH (ref 70–99)
Glucose-Capillary: 313 mg/dL — ABNORMAL HIGH (ref 70–99)
Glucose-Capillary: 217 mg/dL — ABNORMAL HIGH (ref 70–99)

## 2018-08-11 LAB — URINE CULTURE: Culture: 80000 — AB

## 2018-08-11 LAB — PHOSPHORUS: Phosphorus: 3.9 mg/dL (ref 2.5–4.6)

## 2018-08-11 LAB — HAPTOGLOBIN: Haptoglobin: 337 mg/dL (ref 42–346)

## 2018-08-11 LAB — MAGNESIUM
Magnesium: 2.1 mg/dL (ref 1.7–2.4)
Magnesium: 2.2 mg/dL (ref 1.7–2.4)

## 2018-08-11 LAB — PATHOLOGIST SMEAR REVIEW

## 2018-08-11 MED ORDER — FAMOTIDINE 40 MG/5ML PO SUSR
20.0000 mg | Freq: Every day | ORAL | Status: DC
  Administered 2018-08-11 – 2018-08-16 (×6): 20 mg
  Filled 2018-08-11 (×6): qty 2.5

## 2018-08-11 MED ORDER — INSULIN ASPART 100 UNIT/ML ~~LOC~~ SOLN: 3.0000 [IU] | Freq: Three times a day (TID) | SUBCUTANEOUS | Status: DC

## 2018-08-11 MED ORDER — HYDRALAZINE HCL 25 MG PO TABS
25.0000 mg | ORAL_TABLET | Freq: Two times a day (BID) | ORAL | Status: DC
  Administered 2018-08-12 – 2018-08-17 (×8): 25 mg
  Filled 2018-08-11 (×9): qty 1

## 2018-08-11 MED ORDER — FUROSEMIDE 40 MG PO TABS
40.0000 mg | ORAL_TABLET | Freq: Two times a day (BID) | ORAL | Status: DC
  Administered 2018-08-12: 40 mg via ORAL
  Filled 2018-08-11: qty 1

## 2018-08-11 MED ORDER — FUROSEMIDE 10 MG/ML IJ SOLN
60.0000 mg | Freq: Once | INTRAMUSCULAR | Status: AC
  Administered 2018-08-11: 60 mg via INTRAVENOUS
  Filled 2018-08-11: qty 6

## 2018-08-11 MED ORDER — INSULIN ASPART 100 UNIT/ML ~~LOC~~ SOLN
2.0000 [IU] | SUBCUTANEOUS | Status: DC
  Administered 2018-08-11 – 2018-08-17 (×33): 2 [IU] via SUBCUTANEOUS

## 2018-08-11 NOTE — Progress Notes (Signed)
PROGRESS NOTE    Loretta Bell  KYH:[id number] DOB: 1940-08-02 DOA: 07/31/2018 PCP: Gilford Silvius, MD   Brief Narrative: Patient is a 78 year old female with history of chronic hypoxic respiratory failure on trach collar at [facility], A. fib, history of aortic valve endocarditis, CKD stage IV, diabetes mellitus, hypertension, congestive heart failure, history of pulmonary hemorrhage, UTIs who was sent from [facility] due to desaturation.  She was sent to the emergency department on vent.  Nursing staff reported that she had increased bilateral lower extremity edema including anasarca.  She was not on diuretics on [facility].  Patient was admitted under PCCM service.  She was diuresed with IV Lasix.  Patient handed over to [facility] on 08/11/2018.Currently on Trach Collar.  Assessment & Plan:   Active Problems:   Acute respiratory failure (HCC)   Acute on chronic hypoxic respiratory failure: Most likely secondary to volume overload secondary to congestive heart failure. She is on trach collar on baseline.  She was diuresed with IV Lasix.  Currently she is on baseline oxygen requirement, on trach collar.  Suspected pneumonia: Started on vancomycin and meropenem.  She has history of enterococcus bacteremia and also drug-resistant UTI.  Cultures have been sent.  So far negative.  She was started on cefepime at [facility] on 08/06/2018.  Will discontinue the antibiotics tomorrow if cultures remain negative.  Acute on chronic congestive heart failure : Echocardiogram done here showed reduced ejection fraction of 35 to 40%.  Patient not on Lasix at baseline.  Started on Lasix.  Continue carvedilol.  Will discontinue Cardizem.  Not a candidate for ACE or ARB due to hypotension, acute kidney injury.  History of recent pulmonary hemorrhage: Reported at outside hospital, prior hospitalization on 6/20.  On Eliquis which has been held.  Hyponatremia/hypokalemia: Improving  Normocytic anemia: Stable.  Continue to  monitor CBC.  Thrombocytopenia: Has diffusely scattered petechiae and ecchymosis.  Eliquis has been held.  Smear without schistocytes.  AKI on CKD stage III: Kidney function improving with diuresis.  Currently kidney function close to baseline  Hyperlipidemia: On Lipitor, gemfibrozil  Glaucoma: Continue eyedrops  Chronic A. fib: Currently rate is controlled.  Continue carvedilol.  Eliquis held due to anemia, thrombocytopenia and petechiae/ecchymosis  Diabetes mellitus: Continue insulin as ordered.  Diabetic currently following  Hypertension: On Coreg, hydralazine and Cardizem.BP stable  Nutrition Problem: Inadequate oral intake Etiology: acute illness      DVT prophylaxis: SCD Code Status: partial Family Communication: None Disposition Plan: To [facility] tomorrow if remained hemodynamically stable   Consultants: PCCM  Procedures: None Antimicrobials:  Anti-infectives (From admission, onward)   Start     Dose/Rate Route Frequency Ordered Stop   08/10/18 2200  vancomycin (VANCOCIN) 1,250 mg in sodium chloride 0.9 % 250 mL IVPB     1,250 mg 166.7 mL/hr over 90 Minutes Intravenous Every 24 hours 08/08/2018 2228     07/31/2018 2230  vancomycin (VANCOCIN) 1,500 mg in sodium chloride 0.9 % 500 mL IVPB     1,500 mg 250 mL/hr over 120 Minutes Intravenous  Once 07/30/2018 2228 08/10/18 0235   07/20/2018 2230  meropenem (MERREM) 1 g in sodium chloride 0.9 % 100 mL IVPB     1 g 200 mL/hr over 30 Minutes Intravenous Every 12 hours 08/08/2018 2228        Subjective:  Patient seen and examined the bedside this morning.  Currently hemodynamically stable.  On trach collar.  Maintaining saturation on 5 L of oxygen.  She is not in any  respiratory distress.  She opens her eyes when calling her name her shaking her body.  Objective: Vitals:   08/11/18 1000 08/11/18 1100 08/11/18 1127 08/11/18 1151  BP: 117/78 108/80 108/80   Pulse: 67 (!) 125 87   Resp: '13 16 18   '$ Temp:    (!) 96.1 F (35.6  C)  TempSrc:    Axillary  SpO2: 100% 99% 99%   Weight:      Height:        Intake/Output Summary (Last 24 hours) at 08/11/2018 1334 Last data filed at 08/11/2018 1152 Gross per 24 hour  Intake 1395.97 ml  Output 2250 ml  Net -854.03 ml   Filed Weights   08/10/18 0500 08/11/18 0500  Weight: 106.2 kg 105.4 kg    Examination:  General exam: Chronically ill looking, morbid obesity  HEENT: Trach collar Respiratory system: Bilateral decreased air entry in the bases Cardiovascular system: A. fib. No JVD, murmurs, rubs, gallops or clicks.  Bilateral pedal edema. Gastrointestinal system: Abdomen is nondistended, soft and nontender. No organomegaly or masses felt. Normal bowel sounds heard.  PEG Central nervous system: Not alert and oriented.  Extremities: Bilateral lower extremity edema Skin: Scattered ecchymosis, petechiae   Data Reviewed: I have personally reviewed following labs and imaging studies  CBC: Recent Labs  Lab 07/31/2018 1552 07/25/2018 1658 08/10/18 0505 08/11/18 0446  WBC 11.3*  --  11.6* 13.7*  NEUTROABS 9.6*  --   --   --   HGB 8.2* 8.5* 8.2* 8.3*  HCT 26.6* 25.0* 25.9* 26.6*  MCV 96.4  --  94.9 96.4  PLT 95*  --  81* 88*   Basic Metabolic Panel: Recent Labs  Lab 07/26/2018 1552 08/01/2018 1658 08/10/18 0505 08/10/18 1600 08/11/18 0446  NA 130* 132* 134*  --  130*  K 3.7 3.7 3.4*  --  3.6  CL 96*  --  95*  --  96*  CO2 25  --  27  --  27  GLUCOSE 138*  --  80  --  281*  BUN 87*  --  86*  --  88*  CREATININE 1.60*  --  1.51*  --  1.49*  CALCIUM 8.5*  --  8.5*  --  8.2*  MG  --   --  2.4 2.4 2.1  PHOS  --   --  3.4 3.7 3.9   GFR: Estimated Creatinine Clearance: 36.6 mL/min (A) (by C-G formula based on SCr of 1.49 mg/dL (H)). Liver Function Tests: Recent Labs  Lab 07/12/2018 1552  AST 30  ALT 24  ALKPHOS 154*  BILITOT 1.1  PROT 5.0*  ALBUMIN 1.9*   No results for input(s): LIPASE, AMYLASE in the last 168 hours. No results for input(s): AMMONIA  in the last 168 hours. Coagulation Profile: Recent Labs  Lab 07/14/2018 1552  INR 1.1   Cardiac Enzymes: No results for input(s): CKTOTAL, CKMB, CKMBINDEX, TROPONINI in the last 168 hours. BNP (last 3 results) No results for input(s): PROBNP in the last 8760 hours. HbA1C: No results for input(s): HGBA1C in the last 72 hours. CBG: Recent Labs  Lab 08/10/18 1933 08/10/18 2345 08/11/18 0345 08/11/18 0733 08/11/18 1111  GLUCAP 163* 231* 241* 276* 292*   Lipid Profile: No results for input(s): CHOL, HDL, LDLCALC, TRIG, CHOLHDL, LDLDIRECT in the last 72 hours. Thyroid Function Tests: No results for input(s): TSH, T4TOTAL, FREET4, T3FREE, THYROIDAB in the last 72 hours. Anemia Panel: No results for input(s): VITAMINB12, FOLATE, FERRITIN, TIBC, IRON, RETICCTPCT  in the last 72 hours. Sepsis Labs: Recent Labs  Lab 07/12/2018 1552  LATICACIDVEN 1.1    Recent Results (from the past 240 hour(s))  Urine Culture     Status: None (Preliminary result)   Collection Time: 07/16/2018  4:15 PM   Specimen: Urine, Catheterized  Result Value Ref Range Status   Specimen Description URINE, CATHETERIZED  Final   Special Requests NONE  Final   Culture   Final    CULTURE REINCUBATED FOR BETTER GROWTH Performed at [facility], [address], [place], [place] [address]    Report Status PENDING  Incomplete  SARS Coronavirus 2 (CEPHEID - Performed in [place] hospital lab), Hosp Order     Status: None   Collection Time: 07/31/2018  4:42 PM   Specimen: Nasopharyngeal Swab  Result Value Ref Range Status   SARS Coronavirus 2 NEGATIVE NEGATIVE Final    Comment: (NOTE) If result is NEGATIVE SARS-CoV-2 target nucleic acids are NOT DETECTED. The SARS-CoV-2 RNA is generally detectable in upper and lower  respiratory specimens during the acute phase of infection. The lowest  concentration of SARS-CoV-2 viral copies this assay can detect is 250  copies / mL. A negative result does not preclude  SARS-CoV-2 infection  and should not be used as the sole basis for treatment or other  patient management decisions.  A negative result may occur with  improper specimen collection / handling, submission of specimen other  than nasopharyngeal swab, presence of viral mutation(s) within the  areas targeted by this assay, and inadequate number of viral copies  (<250 copies / mL). A negative result must be combined with clinical  observations, patient history, and epidemiological information. If result is POSITIVE SARS-CoV-2 target nucleic acids are DETECTED. The SARS-CoV-2 RNA is generally detectable in upper and lower  respiratory specimens dur ing the acute phase of infection.  Positive  results are indicative of active infection with SARS-CoV-2.  Clinical  correlation with patient history and other diagnostic information is  necessary to determine patient infection status.  Positive results do  not rule out bacterial infection or co-infection with other viruses. If result is PRESUMPTIVE POSTIVE SARS-CoV-2 nucleic acids MAY BE PRESENT.   A presumptive positive result was obtained on the submitted specimen  and confirmed on repeat testing.  While 2019 novel coronavirus  (SARS-CoV-2) nucleic acids may be present in the submitted sample  additional confirmatory testing may be necessary for epidemiological  and / or clinical management purposes  to differentiate between  SARS-CoV-2 and other Sarbecovirus currently known to infect humans.  If clinically indicated additional testing with an alternate test  methodology [phone]) is advised. The SARS-CoV-2 RNA is generally  detectable in upper and lower respiratory sp ecimens during the acute  phase of infection. The expected result is Negative. Fact Sheet for Patients:  StrictlyIdeas.no Fact Sheet for Healthcare Providers: BankingDealers.co.za This test is not yet approved or cleared by the  [place] FDA and has been authorized for detection and/or diagnosis of SARS-CoV-2 by FDA under an Emergency Use Authorization (EUA).  This EUA will remain in effect (meaning this test can be used) for the duration of the COVID-19 declaration under Section 564(b)(1) of the Act, 21 U.S.C. section 360bbb-3(b)(1), unless the authorization is terminated or revoked sooner. Performed at [facility], [place] [address] [place], [place] [address]   MRSA PCR Screening     Status: None   Collection Time: 08/10/18 12:16 AM   Specimen: Nasal  Mucosa; Nasopharyngeal  Result Value Ref Range Status   MRSA by PCR NEGATIVE NEGATIVE Final    Comment:        The GeneXpert MRSA Assay (FDA approved for NASAL specimens only), is one component of a comprehensive MRSA colonization surveillance program. It is not intended to diagnose MRSA infection nor to guide or monitor treatment for MRSA infections. Performed at [facility], [place] [address], [place], [place] [address]   Culture, blood (Routine X 2) w Reflex to ID Panel     Status: None (Preliminary result)   Collection Time: 08/10/18  5:03 AM   Specimen: BLOOD RIGHT HAND  Result Value Ref Range Status   Specimen Description BLOOD RIGHT HAND  Final   Special Requests   Final    BOTTLES DRAWN AEROBIC ONLY Blood Culture results may not be optimal due to an inadequate volume of blood received in culture bottles   Culture   Final    NO GROWTH 1 DAY Performed at [facility], [place] [address], [place], [place] [address]    Report Status PENDING  Incomplete  Culture, blood (routine x 2)     Status: None (Preliminary result)   Collection Time: 08/10/18  5:05 AM   Specimen: BLOOD  Result Value Ref Range Status   Specimen Description BLOOD PICC LINE  Final   Special Requests   Final    BOTTLES DRAWN AEROBIC ONLY Blood Culture results may not be optimal due to an inadequate volume of blood received in culture bottles   Culture   Final     NO GROWTH 1 DAY Performed at [facility], [place] [address] [place],  [address]    Report Status PENDING  Incomplete         Radiology Studies: Dg Chest Port 1 View  Result Date: 08/01/2018 CLINICAL DATA:  Shortness of breath. EXAM: PORTABLE CHEST 1 VIEW COMPARISON:  07/29/2018 FINDINGS: The tracheostomy tube is stable.  The pacer wires are stable. Persistent cardiac enlargement and tortuous calcified thoracic aorta. Persistent asymmetric vascular congestion and patchy right lung infiltrates. Persistent left lower lobe density likely combination of effusion, atelectasis and infiltrate. IMPRESSION: 1. Stable tracheostomy tube. 2. Stable cardiac enlargement. 3. Persistent bilateral infiltrates and probable left effusion. Electronically Signed   By: Marijo Sanes M.D.   On: 07/30/2018 16:58        Scheduled Meds: . amantadine  100 mg Per Tube Daily  . carvedilol  12.5 mg Oral BID WC  . chlorhexidine  5 mL Mouth/Throat BID  . chlorhexidine gluconate (MEDLINE KIT)  15 mL Mouth Rinse BID  . Chlorhexidine Gluconate Cloth  6 each Topical Daily  . cholecalciferol  1,000 Units Per Tube Daily  . citalopram  10 mg Per Tube Daily  . clonazePAM  0.25 mg Per Tube BID  . famotidine  20 mg Per Tube QHS  . feeding supplement (PRO-STAT SUGAR FREE 64)  30 mL Per Tube TID  . folic acid  0.5 mg Per Tube Daily  . [START ON 08/12/2018] furosemide  40 mg Oral BID  . hydrALAZINE  25 mg Oral BID  . insulin aspart  0-9 Units Subcutaneous Q4H  . insulin aspart  2 Units Subcutaneous Q4H  . latanoprost  1 drop Both Eyes QHS  . loratadine  10 mg Per Tube Daily  . mouth rinse  15 mL Mouth Rinse 10 times per day  . modafinil  100 mg Per Tube Daily  .  polyethylene glycol  17 g Per Tube Daily  . QUEtiapine  50 mg Per Tube BID  . timolol  1 drop Both Eyes BID   Continuous Infusions: . feeding supplement (OSMOLITE 1.2 CAL) 1,000 mL (08/11/18 0830)  . meropenem (MERREM) IV Stopped (08/11/18 0850)   . vancomycin Stopped (08/11/18 0612)     LOS: 2 days    Time spent: 55 mins.More than 50% of that time was spent in counseling and/or coordination of care.      Shelly Coss, MD [facility] Pager [phone]  If 7PM-7AM, please contact night-coverage [email or web address] Password [email or web address] 08/11/2018, 1:34 PM

## 2018-08-11 NOTE — Progress Notes (Signed)
Pt foley leaked. 8 mL was in the balloon. 10 mL replaced. Will continue to assess.

## 2018-08-11 NOTE — Progress Notes (Signed)
Pt currently on trach collar, tolerating well at this time.

## 2018-08-11 NOTE — Plan of Care (Signed)

## 2018-08-11 NOTE — TOC Initial Note (Signed)
Transition of Care [facility]) - Initial/Assessment Note    Patient Details  Name: Loretta Bell MRN: [id number] Date of Birth: [birth year]  Transition of Care [facility]) CM/SW Contact:    Alberteen Sam, LCSW Phone Number: 08/11/2018, 2:35 PM  Clinical Narrative:                  CSW spoke with patient's spouse Fritz Pickerel regarding patient's discharge plan, Fritz Pickerel is in agreement with patient returning to SNF at discharge. CSW informed Fritz Pickerel this would potentially be tomorrow, Fritz Pickerel happy to hear patient is doing better. He would like to be updated about what time patient will transfer tomorrow.   CSW spoke with Erline Levine at [facility], she reports patient can discharge back to [facility]. She is not working tomorrow but reports she can discharge back anytime tomorrow, number for report will be [phone] and DC summary will need to be faxed to [phone]. Patient will go to Room 303 and be seen by Dr. Blenda Mounts.   Expected Discharge Plan: Skilled Nursing Facility Barriers to Discharge: Continued Medical Work up   Patient Goals and CMS Choice   CMS Medicare.gov Compare Post Acute Care list provided to:: Patient Represenative (must comment)(Larry (spouse)) Choice offered to / list presented to : Spouse(Larry)  Expected Discharge Plan and Services Expected Discharge Plan: [place] Choice: [place] Living arrangements for the past 2 months: Skilled Nursing Facility([facility])                                      Prior Living Arrangements/Services Living arrangements for the past 2 months: Skilled Nursing Facility([facility]) Lives with:: Self Patient language and need for interpreter reviewed:: Yes Do you feel safe going back to the place where you live?: Yes      Need for Family Participation in Patient Care: Yes (Comment) Care giver support system in place?: Yes (comment)   Criminal Activity/Legal Involvement Pertinent to Current  Situation/Hospitalization: No - Comment as needed  Activities of Daily Living      Permission Sought/Granted Permission sought to share information with : Case Manager, Customer service manager, Family Supports    Share Information with NAME: Fritz Pickerel  Permission granted to share info w AGENCY: SNFs  Permission granted to share info w Relationship: spouse  Permission granted to share info w Contact Information: [phone]  Emotional Assessment Appearance:: Other (Comment Required(unable to assess) Attitude/Demeanor/Rapport: Unable to Assess Affect (typically observed): Unable to Assess Orientation: : (responds to voice (trach)) Alcohol / Substance Use: Not Applicable Psych Involvement: No (comment)  Admission diagnosis:  Acute respiratory failure with hypoxia (HCC) [J96.01] Acute on chronic congestive heart failure, unspecified heart failure type [facility]) [I50.9] Patient Active Problem List   Diagnosis Date Noted  . Acute respiratory failure ([place]) 07/18/2018  . Acute on chronic respiratory failure with hypoxia ([place])   . Chronic atrial fibrillation   . Chronic kidney disease, stage IV (severe) ([place])   . Pulmonary alveolar hemorrhage   . Acute on chronic diastolic heart failure ([place])    PCP:  Gilford Silvius, MD Pharmacy:  No Pharmacies Listed    Social Determinants of Health (SDOH) Interventions    Readmission Risk Interventions No flowsheet data found.

## 2018-08-11 NOTE — NC FL2 (Signed)
[place] LEVEL OF CARE SCREENING TOOL     IDENTIFICATION  Patient Name: Loretta Bell Birthdate: 1940-06-17 Sex: female Admission Date (Current Location): 07/30/2018  [facility] and [place] Number:  Herbalist and Address:  [facility], [place] [address] [place], [place] [address]      Provider Number: [id number]  Attending Physician Name and Address:  Shelly Coss, MD  Relative Name and Phone Number:  Fritz Pickerel (BJYNWG)[phone]    Current Level of Care: Hospital Recommended Level of Care: [place] Prior Approval Number:    Date Approved/Denied:   PASRR Number: [id number]  Discharge Plan: SNF    Current Diagnoses: Patient Active Problem List   Diagnosis Date Noted  . Acute respiratory failure ([place]) 07/17/2018  . Acute on chronic respiratory failure with hypoxia ([place])   . Chronic atrial fibrillation   . Chronic kidney disease, stage IV (severe) ([place])   . Pulmonary alveolar hemorrhage   . Acute on chronic diastolic heart failure (HCC)     Orientation RESPIRATION BLADDER Height & Weight     (follows voice and responds to commands)  Tracheostomy(19m cuffed) Indwelling catheter Weight: 232 lb 5.8 oz (105.4 kg) Height:  '5\' 2"'$  (157.5 cm)  BEHAVIORAL SYMPTOMS/MOOD NEUROLOGICAL BOWEL NUTRITION STATUS      Continent Feeding tube(peg tube, tube feedings)  AMBULATORY STATUS COMMUNICATION OF NEEDS Skin   Total Care Non-Verbally PU Stage and Appropriate Care(pressure injury unstageable on anus, pressure injury stage 2 on thigh)                       Personal Care Assistance Level of Assistance  Bathing, Feeding, Dressing, Total care Bathing Assistance: Maximum assistance Feeding assistance: Maximum assistance(tube feeds/peg tube) Dressing Assistance: Maximum assistance Total Care Assistance: Maximum assistance   Functional Limitations Info  Sight, Hearing, Speech Sight Info: Adequate Hearing  Info: Adequate Speech Info: Impaired(trach)    SPECIAL CARE FACTORS FREQUENCY  PT (By licensed PT), OT (By licensed OT)     PT Frequency: min 5x weekly OT Frequency: min 5x weekly            Contractures Contractures Info: Not present    Additional Factors Info  Code Status, Allergies Code Status Info: Partial Allergies Info: Diphenhydramine Hcl Piperacillin Sod-tazobactam So Sulfamethoxazole-trimethoprim Cefazolin Saxagliptin Sulfamethoxazole           Current Medications (08/11/2018):  This is the current hospital active medication list Current Facility-Administered Medications  Medication Dose Route Frequency Provider Last Rate Last Dose  . amantadine (SYMMETREL) capsule 100 mg  100 mg Per Tube Daily Meccariello, BBernita Raisin DO   100 mg at 08/11/18 0824  . carvedilol (COREG) tablet 12.5 mg  12.5 mg Oral BID WC OFrederik Pear MD   12.5 mg at 08/11/18 0828  . chlorhexidine (PERIDEX) 0.12 % solution 5 mL  5 mL Mouth/Throat BID Meccariello, Bailey J, DO   5 mL at 08/11/18 0829  . chlorhexidine gluconate (MEDLINE KIT) (PERIDEX) 0.12 % solution 15 mL  15 mL Mouth Rinse BID SJennelle HumanB, NP   15 mL at 08/11/18 0828  . Chlorhexidine Gluconate Cloth 2 % PADS 6 each  6 each Topical Daily MMarshell Garfinkel MD   6 each at 08/11/18 0829  . cholecalciferol (VITAMIN D3) tablet 1,000 Units  1,000 Units Per Tube Daily Meccariello, BBernita Raisin DO   1,000 Units at 08/11/18 0826  . citalopram (CELEXA) tablet 10 mg  10 mg  Per Tube Daily Meccariello, Bernita Raisin, DO   10 mg at 08/11/18 9150  . clonazepam (KLONOPIN) disintegrating tablet 0.25 mg  0.25 mg Per Tube BID Meccariello, Bernita Raisin, DO   0.25 mg at 08/11/18 4136  . famotidine (PEPCID) 40 MG/5ML suspension 20 mg  20 mg Per Tube QHS Mannam, Praveen, MD      . feeding supplement (OSMOLITE 1.2 CAL) liquid 1,000 mL  1,000 mL Per Tube Continuous Mannam, Praveen, MD 60 mL/hr at 08/11/18 0830 1,000 mL at 08/11/18 0830  . feeding supplement (PRO-STAT  SUGAR FREE 64) liquid 30 mL  30 mL Per Tube TID Mannam, Praveen, MD   30 mL at 08/11/18 0841  . fentaNYL (SUBLIMAZE) injection 25-50 mcg  25-50 mcg Intravenous Q30 min PRN Jennelle Human B, NP      . folic acid (FOLVITE) tablet 0.5 mg  0.5 mg Per Tube Daily Meccariello, Bernita Raisin, DO   0.5 mg at 08/11/18 0827  . [START ON 08/12/2018] furosemide (LASIX) tablet 40 mg  40 mg Oral BID Shelly Coss, MD      . hydrALAZINE (APRESOLINE) tablet 25 mg  25 mg Oral BID Frederik Pear, MD   25 mg at 08/11/18 0826  . insulin aspart (novoLOG) injection 0-9 Units  0-9 Units Subcutaneous Q4H Jennelle Human B, NP   5 Units at 08/11/18 1126  . insulin aspart (novoLOG) injection 2 Units  2 Units Subcutaneous Q4H Shelly Coss, MD   2 Units at 08/11/18 1347  . latanoprost (XALATAN) 0.005 % ophthalmic solution 1 drop  1 drop Both Eyes QHS Meccariello, Bailey J, DO   1 drop at 08/10/18 2200  . loratadine (CLARITIN) tablet 10 mg  10 mg Per Tube Daily Meccariello, Bernita Raisin, DO   10 mg at 08/11/18 4383  . MEDLINE mouth rinse  15 mL Mouth Rinse 10 times per day Jennelle Human B, NP   15 mL at 08/11/18 1350  . meropenem (MERREM) 1 g in sodium chloride 0.9 % 100 mL IVPB  1 g Intravenous Q12H Bertis Ruddy, [facility]   Stopped at 08/11/18 0850  . modafinil (PROVIGIL) tablet 100 mg  100 mg Per Tube Daily Meccariello, Bernita Raisin, DO   100 mg at 08/11/18 7793  . oxyCODONE (Oxy IR/ROXICODONE) immediate release tablet 5 mg  5 mg Per Tube Q6H PRN Meccariello, Bernita Raisin, DO      . polyethylene glycol (MIRALAX / GLYCOLAX) packet 17 g  17 g Per Tube Daily Meccariello, Bailey J, DO   17 g at 08/11/18 0827  . QUEtiapine (SEROQUEL) tablet 50 mg  50 mg Per Tube BID Meccariello, Bernita Raisin, DO   50 mg at 08/11/18 9688  . timolol (TIMOPTIC) 0.5 % ophthalmic solution 1 drop  1 drop Both Eyes BID Meccariello, Bailey J, DO   1 drop at 08/11/18 0830  . vancomycin (VANCOCIN) 1,250 mg in sodium chloride 0.9 % 250 mL IVPB  1,250 mg Intravenous Q24H Bertis Ruddy, [facility]   Stopped at 08/11/18 6484     Discharge Medications: Please see discharge summary for a list of discharge medications.  Relevant Imaging Results:  Relevant Lab Results:   Additional Information SSN: [id number]  Alberteen Sam, LCSW

## 2018-08-11 NOTE — Progress Notes (Signed)
Inpatient Diabetes Program Recommendations  AACE/ADA: New Consensus Statement on Inpatient Glycemic Control (2015)  Target Ranges:  Prepandial:   less than 140 mg/dL      Peak postprandial:   less than 180 mg/dL (1-2 hours)      Critically ill patients:  140 - 180 mg/dL   Results for Loretta Bell, Loretta Bell (MRN [id number]) as of 08/11/2018 12:46  Ref. Range 08/10/2018 07:37 08/10/2018 08:48 08/10/2018 11:56 08/10/2018 12:11 08/10/2018 12:52 08/10/2018 15:58 08/10/2018 19:33 08/10/2018 23:45 08/11/2018 03:45 08/11/2018 07:33 08/11/2018 11:11  Glucose-Capillary Latest Ref Range: 70 - 99 mg/dL 54 (L) 116 (H) 59 (L) 61 (L) 151 (H) 111 (H) 163 (H) 231 (H) 241 (H) 276 (H) 292 (H)   Review of Glycemic Control  Diabetes history: DM 2 Outpatient Diabetes medications: Lantus 15 units daily, Novolog 0-10 units tid with meals  Current orders for Inpatient glycemic control: Novolog sensitive q 4 hours  Inpatient Diabetes Program Recommendations:    Tube Feeds going 60 ml/hour Glucose trends increased into the 200 range.   Consider Novolog 3 units Q4 Tube Feed Coverage (Do not give if Tube Feeds are stopped for held).  Thanks,  Tama Headings RN, MSN, BC-ADM Inpatient Diabetes Coordinator Team Pager [phone] (8a-5p)

## 2018-08-12 LAB — PROTIME-INR
INR: 1.3 — ABNORMAL HIGH (ref 0.8–1.2)
Prothrombin Time: 15.9 seconds — ABNORMAL HIGH (ref 11.4–15.2)

## 2018-08-12 LAB — BASIC METABOLIC PANEL
Anion gap: 10 (ref 5–15)
BUN: 95 mg/dL — ABNORMAL HIGH (ref 8–23)
CO2: 28 mmol/L (ref 22–32)
Calcium: 8.4 mg/dL — ABNORMAL LOW (ref 8.9–10.3)
Chloride: 95 mmol/L — ABNORMAL LOW (ref 98–111)
Creatinine, Ser: 1.5 mg/dL — ABNORMAL HIGH (ref 0.44–1.00)
GFR calc Af Amer: 39 mL/min — ABNORMAL LOW (ref >60)
GFR calc non Af Amer: 33 mL/min — ABNORMAL LOW (ref >60)
Glucose, Bld: 162 mg/dL — ABNORMAL HIGH (ref 70–99)
Potassium: 4.2 mmol/L (ref 3.5–5.1)
Sodium: 133 mmol/L — ABNORMAL LOW (ref 135–145)

## 2018-08-12 LAB — GLUCOSE, CAPILLARY
Glucose-Capillary: 154 mg/dL — ABNORMAL HIGH (ref 70–99)
Glucose-Capillary: 189 mg/dL — ABNORMAL HIGH (ref 70–99)
Glucose-Capillary: 162 mg/dL — ABNORMAL HIGH (ref 70–99)
Glucose-Capillary: 146 mg/dL — ABNORMAL HIGH (ref 70–99)
Glucose-Capillary: 159 mg/dL — ABNORMAL HIGH (ref 70–99)
Glucose-Capillary: 150 mg/dL — ABNORMAL HIGH (ref 70–99)

## 2018-08-12 LAB — ZINC: Zinc: 57 ug/dL (ref 56–134)

## 2018-08-12 MED ORDER — FUROSEMIDE 10 MG/ML IJ SOLN
40.0000 mg | Freq: Two times a day (BID) | INTRAMUSCULAR | Status: DC
  Administered 2018-08-12 – 2018-08-13 (×2): 40 mg via INTRAVENOUS
  Filled 2018-08-12 (×2): qty 4

## 2018-08-12 MED ORDER — MORPHINE SULFATE (PF) 2 MG/ML IV SOLN
2.0000 mg | INTRAVENOUS | Status: DC | PRN
  Administered 2018-08-13 (×2): 2 mg via INTRAVENOUS
  Filled 2018-08-12 (×2): qty 1

## 2018-08-12 NOTE — Progress Notes (Signed)
Patient not voiding. Has chronic foley catheter in from [place]. Bladder scan done, shows 350 mL. Updated MD Adhikari. Ordered to replace foley catheter.   Upon removal of catheter, noticed a stone at the tip of the catheter. Patient immediately started voiding. MD called and updated. Stated it's okay to re-insert new foley catheter. Patient voided 400 mL of yellow, sediment urine. Shortly after, patient voiding red, sediment urine. MD updated, stated to monitor and probably its due to passed stone.   Will continue to monitor.

## 2018-08-12 NOTE — Consult Note (Signed)
[facility] Nurse wound consult note Reason for Consult:Petechiae present to thighs, arms and face.  Anasarca present.  Blistering to arms, resulting in ruptured nonintact lesions.  Wound type:inflammatory Pressure Injury POA:NA Measurement:scattered 1 cm blisters, serum filled.  Some have ruptured on arms, resulting in partial thickness tissue loss.  Wound EQF:DVOU arm lesions are scabbed and necrotic.  Will implement nonadherent vaseline gauze to wounds for atraumatic dressing removal and to promote moist wound healing.  Drainage (amount, consistency, odor) Minimal serous weeping Periwound:Petechiae, ecchymosis and edema Dressing procedure/placement/frequency: Cleanse blistered areas to bilateral arms with soap and water and pat dry.  Apply vaseline gauze to open wounds for atraumatic removal and to promote moist wound healing. Cover with kerlix and tape  Change daily.   Will not follow at this time.  Please re-consult if needed.  Domenic Moras MSN, RN, FNP-BC CWON Wound, Ostomy, Continence Nurse Pager [phone]

## 2018-08-12 NOTE — Progress Notes (Signed)
Called by RN to patient's room, she desaturated into the low 80's after suctioning. The patient had increased WOB, use of accessory muscles, RR of 40 and HR of 125. Patient placed back on vent with previous full support settings. MD has been paged by RN. Will continue to monitor patient.

## 2018-08-12 NOTE — Progress Notes (Signed)
PROGRESS NOTE    Loretta Bell  MWU:[id number] DOB: [birth year] DOA: 08/07/2018 PCP: Gilford Silvius, MD   Brief Narrative: Patient is a 78 year old female with history of chronic hypoxic respiratory failure on trach collar at [facility], A. fib, history of aortic valve endocarditis, CKD stage IV, diabetes mellitus, hypertension, congestive heart failure, history of pulmonary hemorrhage, UTIs who was sent from [facility] due to desaturation.  She was sent to the emergency department on vent.  Nursing staff reported that she had increased bilateral lower extremity edema including anasarca.  She was not on diuretics on [facility].  Patient was admitted under PCCM service.  She was diuresed with IV Lasix.  Patient handed over to [facility] on 08/11/2018.Currently on Trach Collar.  08/12/18: It was reported that she had increased work of breathing last night with increased respiratory and heart rate.  Saturation dropped to high 80s.  She was put on ventilator.  Back to trach collar this morning Increased oozing from ecchymotic area on the right upper extremity noted.  Wound care consulted.  Patient is still having severe anasarca.  Started on Lasix 40 mg IV twice daily.DC canceled.  Assessment & Plan:   Active Problems:   Acute respiratory failure (HCC)   Acute on chronic hypoxic respiratory failure: Most likely secondary to volume overload secondary to congestive heart failure. She is on trach collar on baseline.   It was reported that she had increased work of breathing last night with increased respiratory and heart rate.  Saturation dropped to high 80s.  She was put on ventilator.  Back to trach collar this morning.  Suspected pneumonia: Started on vancomycin and meropenem.  She has history of enterococcus bacteremia and also drug-resistant UTI.  Cultures have been sent.  So far negative.  She was started on cefepime at [facility] on 08/06/2018.  Will discontinue the antibiotics now.  Acute on chronic  congestive heart failure : Echocardiogram done here showed reduced ejection fraction of 35 to 40%. Likely chronic finding. Patient not on Lasix at baseline.  Started on IV Lasix.  Continue carvedilol.  Will discontinue Cardizem.  Not a candidate for ACE or ARB due to hypotension, acute kidney injury.  History of recent pulmonary hemorrhage: Reported at outside hospital, prior hospitalization on 6/20.  On Eliquis which has been held due to thrombocytopenia, ecchymosis  Hyponatremia/hypokalemia: Improved  Normocytic anemia: Stable.  Continue to monitor CBC.  Thrombocytopenia: Has diffusely scattered petechiae and ecchymosis.  Eliquis has been held.  Smear without schistocytes.  AKI on CKD stage III: Kidney function improved with diuresis.  Currently kidney function close to baseline  Hyperlipidemia: On Lipitor, gemfibrozil  Glaucoma: Continue eyedrops  Chronic A. fib: Currently rate is controlled.  Continue carvedilol.  Eliquis held due to anemia, thrombocytopenia and petechiae/ecchymosis  Diabetes mellitus: Continue insulin as ordered.  Diabetic currently following  Hypertension: On Coreg, hydralazine and Cardizem.BP stable  Nutrition Problem: Inadequate oral intake Etiology: acute illness      DVT prophylaxis: SCD Code Status: partial Family Communication: None Disposition Plan: To [facility] tomorrow if remained hemodynamically stable   Consultants: PCCM  Procedures: None Antimicrobials:  Anti-infectives (From admission, onward)   Start     Dose/Rate Route Frequency Ordered Stop   08/10/18 2200  vancomycin (VANCOCIN) 1,250 mg in sodium chloride 0.9 % 250 mL IVPB  Status:  Discontinued     1,250 mg 166.7 mL/hr over 90 Minutes Intravenous Every 24 hours 07/22/2018 2228 08/12/18 1344   07/11/2018 2230  vancomycin (VANCOCIN) 1,500 mg  in sodium chloride 0.9 % 500 mL IVPB     1,500 mg 250 mL/hr over 120 Minutes Intravenous  Once 08/08/2018 2228 08/10/18 0235   07/25/2018 2230  meropenem  (MERREM) 1 g in sodium chloride 0.9 % 100 mL IVPB  Status:  Discontinued     1 g 200 mL/hr over 30 Minutes Intravenous Every 12 hours 08/08/2018 2228 08/12/18 1344       Objective: Vitals:   08/12/18 1100 08/12/18 1125 08/12/18 1128 08/12/18 1200  BP: 112/74     Pulse: (!) 118 (!) 110 96   Resp: (!) 29 (!) 26 (!) 22   Temp:    97.9 F (36.6 C)  TempSrc:    Axillary  SpO2: 95% 96% 97%   Weight:      Height:        Intake/Output Summary (Last 24 hours) at 08/12/2018 1347 Last data filed at 08/12/2018 1200 Gross per 24 hour  Intake 1679.85 ml  Output 1200 ml  Net 479.85 ml   Filed Weights   08/10/18 0500 08/11/18 0500 08/12/18 0400  Weight: 106.2 kg 105.4 kg 107.3 kg    Examination:   General exam: Chronically ill looking, morbidly obese  HEENT:Trach collar Respiratory system: Bilateral decreased air entry in the bases Cardiovascular system: Afib. No JVD, murmurs, rubs, gallops or clicks. Gastrointestinal system: Abdomen is nondistended, soft and nontender. No organomegaly or masses felt. Normal bowel sounds heard.PEG Central nervous system: Not  Alert and oriented.  Extremities: Anasarca, no clubbing ,no cyanosis Skin: Diffuse ecchymosis, petechiae, oozing from the right upper extremity   Data Reviewed: I have personally reviewed following labs and imaging studies  CBC: Recent Labs  Lab 08/06/2018 1552 08/08/2018 1658 08/10/18 0505 08/11/18 0446  WBC 11.3*  --  11.6* 13.7*  NEUTROABS 9.6*  --   --   --   HGB 8.2* 8.5* 8.2* 8.3*  HCT 26.6* 25.0* 25.9* 26.6*  MCV 96.4  --  94.9 96.4  PLT 95*  --  81* 88*   Basic Metabolic Panel: Recent Labs  Lab 08/01/2018 1552 08/06/2018 1658 08/10/18 0505 08/10/18 1600 08/11/18 0446 08/11/18 2244 08/12/18 0352  NA 130* 132* 134*  --  130*  --  133*  K 3.7 3.7 3.4*  --  3.6  --  4.2  CL 96*  --  95*  --  96*  --  95*  CO2 25  --  27  --  27  --  28  GLUCOSE 138*  --  80  --  281*  --  162*  BUN 87*  --  86*  --  88*  --   95*  CREATININE 1.60*  --  1.51*  --  1.49*  --  1.50*  CALCIUM 8.5*  --  8.5*  --  8.2*  --  8.4*  MG  --   --  2.4 2.4 2.1 2.2  --   PHOS  --   --  3.4 3.7 3.9  --   --    GFR: Estimated Creatinine Clearance: 36.8 mL/min (A) (by C-G formula based on SCr of 1.5 mg/dL (H)). Liver Function Tests: Recent Labs  Lab 07/15/2018 1552  AST 30  ALT 24  ALKPHOS 154*  BILITOT 1.1  PROT 5.0*  ALBUMIN 1.9*   No results for input(s): LIPASE, AMYLASE in the last 168 hours. No results for input(s): AMMONIA in the last 168 hours. Coagulation Profile: Recent Labs  Lab 07/13/2018 1552 08/12/18 0352  INR 1.1  1.3*   Cardiac Enzymes: No results for input(s): CKTOTAL, CKMB, CKMBINDEX, TROPONINI in the last 168 hours. BNP (last 3 results) No results for input(s): PROBNP in the last 8760 hours. HbA1C: No results for input(s): HGBA1C in the last 72 hours. CBG: Recent Labs  Lab 08/11/18 1921 08/11/18 2338 08/12/18 0324 08/12/18 0755 08/12/18 1155  GLUCAP 217* 189* 154* 162* 146*   Lipid Profile: No results for input(s): CHOL, HDL, LDLCALC, TRIG, CHOLHDL, LDLDIRECT in the last 72 hours. Thyroid Function Tests: No results for input(s): TSH, T4TOTAL, FREET4, T3FREE, THYROIDAB in the last 72 hours. Anemia Panel: No results for input(s): VITAMINB12, FOLATE, FERRITIN, TIBC, IRON, RETICCTPCT in the last 72 hours. Sepsis Labs: Recent Labs  Lab 08/07/2018 1552  LATICACIDVEN 1.1    Recent Results (from the past 240 hour(s))  Urine Culture     Status: Abnormal   Collection Time: 07/11/2018  4:15 PM   Specimen: Urine, Catheterized  Result Value Ref Range Status   Specimen Description URINE, CATHETERIZED  Final   Special Requests   Final    NONE Performed at [facility], [address] [place], [place] [address]    Culture 80,000 COLONIES/mL YEAST (A)  Final   Report Status 08/11/2018 FINAL  Final  SARS Coronavirus 2 (CEPHEID - Performed in [place] hospital lab), Hosp Order      Status: None   Collection Time: 07/31/2018  4:42 PM   Specimen: Nasopharyngeal Swab  Result Value Ref Range Status   SARS Coronavirus 2 NEGATIVE NEGATIVE Final    Comment: (NOTE) If result is NEGATIVE SARS-CoV-2 target nucleic acids are NOT DETECTED. The SARS-CoV-2 RNA is generally detectable in upper and lower  respiratory specimens during the acute phase of infection. The lowest  concentration of SARS-CoV-2 viral copies this assay can detect is 250  copies / mL. A negative result does not preclude SARS-CoV-2 infection  and should not be used as the sole basis for treatment or other  patient management decisions.  A negative result may occur with  improper specimen collection / handling, submission of specimen other  than nasopharyngeal swab, presence of viral mutation(s) within the  areas targeted by this assay, and inadequate number of viral copies  (<250 copies / mL). A negative result must be combined with clinical  observations, patient history, and epidemiological information. If result is POSITIVE SARS-CoV-2 target nucleic acids are DETECTED. The SARS-CoV-2 RNA is generally detectable in upper and lower  respiratory specimens dur ing the acute phase of infection.  Positive  results are indicative of active infection with SARS-CoV-2.  Clinical  correlation with patient history and other diagnostic information is  necessary to determine patient infection status.  Positive results do  not rule out bacterial infection or co-infection with other viruses. If result is PRESUMPTIVE POSTIVE SARS-CoV-2 nucleic acids MAY BE PRESENT.   A presumptive positive result was obtained on the submitted specimen  and confirmed on repeat testing.  While 2019 novel coronavirus  (SARS-CoV-2) nucleic acids may be present in the submitted sample  additional confirmatory testing may be necessary for epidemiological  and / or clinical management purposes  to differentiate between  SARS-CoV-2 and other  Sarbecovirus currently known to infect humans.  If clinically indicated additional testing with an alternate test  methodology [phone]) is advised. The SARS-CoV-2 RNA is generally  detectable in upper and lower respiratory sp ecimens during the acute  phase of infection. The expected result is Negative. Fact Sheet for Patients:  StrictlyIdeas.no Fact Sheet for Healthcare Providers: BankingDealers.co.za This test is not yet approved or cleared by the [place] FDA and has been authorized for detection and/or diagnosis of SARS-CoV-2 by FDA under an Emergency Use Authorization (EUA).  This EUA will remain in effect (meaning this test can be used) for the duration of the COVID-19 declaration under Section 564(b)(1) of the Act, 21 U.S.C. section 360bbb-3(b)(1), unless the authorization is terminated or revoked sooner. Performed at [facility], [place] [address], [place], [place] [address]   MRSA PCR Screening     Status: None   Collection Time: 08/10/18 12:16 AM   Specimen: Nasal Mucosa; Nasopharyngeal  Result Value Ref Range Status   MRSA by PCR NEGATIVE NEGATIVE Final    Comment:        The GeneXpert MRSA Assay (FDA approved for NASAL specimens only), is one component of a comprehensive MRSA colonization surveillance program. It is not intended to diagnose MRSA infection nor to guide or monitor treatment for MRSA infections. Performed at [facility], [place] [address], [place], [place] [address]   Culture, blood (Routine X 2) w Reflex to ID Panel     Status: None (Preliminary result)   Collection Time: 08/10/18  5:03 AM   Specimen: BLOOD RIGHT HAND  Result Value Ref Range Status   Specimen Description BLOOD RIGHT HAND  Final   Special Requests   Final    BOTTLES DRAWN AEROBIC ONLY Blood Culture results may not be optimal due to an inadequate volume of blood received in culture bottles   Culture   Final    NO GROWTH 2  DAYS Performed at [facility], [place] [address] [place], [place] [address]    Report Status PENDING  Incomplete  Culture, blood (routine x 2)     Status: None (Preliminary result)   Collection Time: 08/10/18  5:05 AM   Specimen: BLOOD  Result Value Ref Range Status   Specimen Description BLOOD PICC LINE  Final   Special Requests   Final    BOTTLES DRAWN AEROBIC ONLY Blood Culture results may not be optimal due to an inadequate volume of blood received in culture bottles   Culture   Final    NO GROWTH 2 DAYS Performed at [facility], [place] [address], [place],  [address]    Report Status PENDING  Incomplete         Radiology Studies: No results found.      Scheduled Meds: . amantadine  100 mg Per Tube Daily  . carvedilol  12.5 mg Oral BID WC  . chlorhexidine  5 mL Mouth/Throat BID  . chlorhexidine gluconate (MEDLINE KIT)  15 mL Mouth Rinse BID  . Chlorhexidine Gluconate Cloth  6 each Topical Daily  . cholecalciferol  1,000 Units Per Tube Daily  . citalopram  10 mg Per Tube Daily  . clonazePAM  0.25 mg Per Tube BID  . famotidine  20 mg Per Tube QHS  . feeding supplement (PRO-STAT SUGAR FREE 64)  30 mL Per Tube TID  . folic acid  0.5 mg Per Tube Daily  . furosemide  40 mg Intravenous BID  . hydrALAZINE  25 mg Per Tube BID  . insulin aspart  0-9 Units Subcutaneous Q4H  . insulin aspart  2 Units Subcutaneous Q4H  . latanoprost  1 drop Both Eyes QHS  . loratadine  10 mg Per Tube Daily  . mouth rinse  15 mL Mouth Rinse  10 times per day  . modafinil  100 mg Per Tube Daily  . polyethylene glycol  17 g Per Tube Daily  . QUEtiapine  50 mg Per Tube BID  . timolol  1 drop Both Eyes BID   Continuous Infusions: . feeding supplement (OSMOLITE 1.2 CAL) 60 mL/hr at 08/12/18 0700     LOS: 3 days    Time spent: 35 mins.      Shelly Coss, MD [facility] Pager [phone]  If 7PM-7AM, please contact night-coverage [email or web address] Password  [email or web address] 08/12/2018, 1:47 PM

## 2018-08-12 NOTE — Progress Notes (Signed)
Pt has increase work of breathing, increased RR and HR. SpO2 in the high 80%. RT called to bedside. NP Bodenheimer paged. Will continue to assess.

## 2018-08-12 NOTE — Progress Notes (Signed)
Pt placed on ATC at this time tolerating well, RT will continue to monitor

## 2018-08-13 DIAGNOSIS — Z9911 Dependence on respirator [ventilator] status: Secondary | ICD-10-CM

## 2018-08-13 LAB — GLUCOSE, CAPILLARY
Glucose-Capillary: 133 mg/dL — ABNORMAL HIGH (ref 70–99)
Glucose-Capillary: 152 mg/dL — ABNORMAL HIGH (ref 70–99)
Glucose-Capillary: 160 mg/dL — ABNORMAL HIGH (ref 70–99)
Glucose-Capillary: 167 mg/dL — ABNORMAL HIGH (ref 70–99)
Glucose-Capillary: 182 mg/dL — ABNORMAL HIGH (ref 70–99)
Glucose-Capillary: 185 mg/dL — ABNORMAL HIGH (ref 70–99)
Glucose-Capillary: 185 mg/dL — ABNORMAL HIGH (ref 70–99)

## 2018-08-13 LAB — BASIC METABOLIC PANEL
Anion gap: 11 (ref 5–15)
BUN: 107 mg/dL — ABNORMAL HIGH (ref 8–23)
CO2: 27 mmol/L (ref 22–32)
Calcium: 8.5 mg/dL — ABNORMAL LOW (ref 8.9–10.3)
Chloride: 97 mmol/L — ABNORMAL LOW (ref 98–111)
Creatinine, Ser: 1.59 mg/dL — ABNORMAL HIGH (ref 0.44–1.00)
GFR calc Af Amer: 36 mL/min — ABNORMAL LOW (ref >60)
GFR calc non Af Amer: 31 mL/min — ABNORMAL LOW (ref >60)
Glucose, Bld: 188 mg/dL — ABNORMAL HIGH (ref 70–99)
Potassium: 4.3 mmol/L (ref 3.5–5.1)
Sodium: 135 mmol/L (ref 135–145)

## 2018-08-13 MED ORDER — FUROSEMIDE 10 MG/ML IJ SOLN
60.0000 mg | Freq: Three times a day (TID) | INTRAMUSCULAR | Status: DC
  Administered 2018-08-13 – 2018-08-14 (×3): 60 mg via INTRAVENOUS
  Filled 2018-08-13 (×3): qty 6

## 2018-08-13 MED ORDER — MORPHINE SULFATE (PF) 4 MG/ML IV SOLN
4.0000 mg | INTRAVENOUS | Status: DC | PRN
  Administered 2018-08-13 – 2018-08-15 (×5): 4 mg via INTRAVENOUS
  Filled 2018-08-13 (×6): qty 1

## 2018-08-13 MED ORDER — SODIUM CHLORIDE 0.9 % IV SOLN
INTRAVENOUS | Status: DC | PRN
  Administered 2018-08-13: 07:00:00 via INTRAVENOUS

## 2018-08-13 MED ORDER — METOLAZONE 5 MG PO TABS
2.5000 mg | ORAL_TABLET | Freq: Once | ORAL | Status: AC
  Administered 2018-08-13: 2.5 mg via ORAL
  Filled 2018-08-13: qty 1

## 2018-08-13 MED ORDER — FUROSEMIDE 10 MG/ML IJ SOLN: 60.0000 mg | Freq: Two times a day (BID) | INTRAMUSCULAR | Status: DC

## 2018-08-13 MED ORDER — CHLORHEXIDINE GLUCONATE 0.12% ORAL RINSE (MEDLINE KIT)
15.0000 mL | Freq: Two times a day (BID) | OROMUCOSAL | Status: DC
  Administered 2018-08-13 – 2018-08-17 (×8): 15 mL via OROMUCOSAL

## 2018-08-13 NOTE — Progress Notes (Signed)
NAME:  Loretta Bell, MRN:  [id number], DOB:  1941/01/16, LOS: 4 ADMISSION DATE:  07/22/2018, CONSULTATION DATE:  08/08/18 REFERRING MD:  ED, CHIEF COMPLAINT:  hypoxemia  Brief History   Loretta Bell is a 78 year old woman with a hx of chronic respiratory failure (cause unk to me) (on trach collar at [facility]), atrial fibrillation, hx aortic valve endocarditis, CKD Stage IV, DM, HTN, CHF, reported hx of pulmonary hemorrhage, UTIs.   Sent to hospital from [facility] due to acute low oxygen saturation x 1 day.  Was placed back on vent and sent to ED, sat improved. Staff reportedly noted increasing pitting edema in BUE and BLE, anasarca. "worsening renal function" also noted. Baseline mental status - opens eyes to sound.    History of present illness   Recently seen in ER (07/29/18) for hypothermia, found to have UTI.  Started on cipro, changed to mero due to klebsiella.  Vanc recommended due to enterococcus in blood culture.   Past Medical History  chronic respiratory failure (cause unk to me) (on trach collar at [facility]), atrial fibrillation, CKD Stage IV, DM, HTN, HFpEF, reported hx of pulmonary hemorrhage, UTIs.  Hx aortic valve endocarditis (MSSA) Allergic to Cefazolin, Saxagliptin, Sulfamethoxazole, and Zosyn [piperacillin sod-tazobactam so]  Significant Hospital Events     Consults:    Procedures:    Significant Diagnostic Tests:    Micro Data:  Urine culture 6/30  Blood culture 6/30  Antimicrobials:  Meropenem Started PTA (6/23?) Vancomycin  08/10/18  Interim history/subjective:   Needed to go back on vent. pccm notified by [facility] and nursing. Little UOP. Otherwise no issues. Care discussed with staff.   Objective   Blood pressure 123/76, pulse 79, temperature 97.6 F (36.4 C), temperature source Axillary, resp. rate 16, height 5\' 2"  (1.575 m), weight 106.8 kg, SpO2 100 %.    Vent Mode: PRVC FiO2 (%):  [35 %-40 %] 40 % Set Rate:  [16 bmp] 16 bmp Vt Set:  [400 mL]  400 mL PEEP:  [5 cmH20] 5 cmH20 Plateau Pressure:  [17 cmH20-22 cmH20] 17 cmH20   Intake/Output Summary (Last 24 hours) at 08/13/2018 1640 Last data filed at 08/13/2018 1600 Gross per 24 hour  Intake 1475 ml  Output 950 ml  Net 525 ml   Filed Weights   08/11/18 0500 08/12/18 0400 08/13/18 0500  Weight: 105.4 kg 107.3 kg 106.8 kg    Examination: General: obese, minimally responsive on vent  HENT: petechia on face, neck, around trach  Lungs: BL vented breaths  Cardiovascular: RRR, no mrg  Abdomen: bs present, soft, nt, nd  Extremities: 4+ edema, all dependent edema, anasarca Neuro: opens eyes to voice, not following any commands  GU: foley   Resolved Hospital Problem list     Assessment & Plan:   Acute on Chronic Hypoxemic Respiratory Failure requiring tracheostomy and vent dependence  Chronic resp failure, recently weaned off vent this month.  Discharged on 6/19 on trach collar to [facility].  - Needed to go back on ventilator  - May get better with some diuresis  - I have ordered a repeat CXR   Acute on Chronic Diastolic Heart Failure, HFpEF Bilateral pulm edema and effusions appear stable.  + CFB Anasarca - In need of ongoing diuresis  - increased lasix to 60mg  q8h and added single dose metolazone  - if BP gets soft can use albumin with ongoing diuresis   Thrombocytopenia ?pulmonary hemorrhage, just documented, unclear if she was ever bronched - likely  related to elevated inr, eliquis and thrombocytopenia   Rest per primary team.    Best practice:  Diet: npo  Pain/Anxiety/Delirium protocol (if indicated): NAD VAP protocol (if indicated):  DVT prophylaxis: held due to thrombocytopenia and eccymoses, consider starting if HB stable.   GI prophylaxis: pepcid Glucose control: ISS Mobility: bedbound Code Status: DNR Family Communication:  Disposition: ICU  Labs   CBC: Recent Labs  Lab 07/25/2018 1552 07/16/2018 1658 08/10/18 0505 08/11/18 0446  WBC 11.3*  --   11.6* 13.7*  NEUTROABS 9.6*  --   --   --   HGB 8.2* 8.5* 8.2* 8.3*  HCT 26.6* 25.0* 25.9* 26.6*  MCV 96.4  --  94.9 96.4  PLT 95*  --  81* 88*    Basic Metabolic Panel: Recent Labs  Lab 08/02/2018 1552 07/19/2018 1658 08/10/18 0505 08/10/18 1600 08/11/18 0446 08/11/18 2244 08/12/18 0352 08/13/18 0523  NA 130* 132* 134*  --  130*  --  133* 135  K 3.7 3.7 3.4*  --  3.6  --  4.2 4.3  CL 96*  --  95*  --  96*  --  95* 97*  CO2 25  --  27  --  27  --  28 27  GLUCOSE 138*  --  80  --  281*  --  162* 188*  BUN 87*  --  86*  --  88*  --  95* 107*  CREATININE 1.60*  --  1.51*  --  1.49*  --  1.50* 1.59*  CALCIUM 8.5*  --  8.5*  --  8.2*  --  8.4* 8.5*  MG  --   --  2.4 2.4 2.1 2.2  --   --   PHOS  --   --  3.4 3.7 3.9  --   --   --    GFR: Estimated Creatinine Clearance: 34.6 mL/min (A) (by C-G formula based on SCr of 1.59 mg/dL (H)). Recent Labs  Lab 07/19/2018 1552 08/10/18 0505 08/11/18 0446  WBC 11.3* 11.6* 13.7*  LATICACIDVEN 1.1  --   --     Liver Function Tests: Recent Labs  Lab 08/08/2018 1552  AST 30  ALT 24  ALKPHOS 154*  BILITOT 1.1  PROT 5.0*  ALBUMIN 1.9*   No results for input(s): LIPASE, AMYLASE in the last 168 hours. No results for input(s): AMMONIA in the last 168 hours.  ABG    Component Value Date/Time   PHART 7.512 (H) 07/01/2018 1755   PCO2ART 38.8 07/01/2018 1755   PO2ART 71.0 (L) 07/01/2018 1755   HCO3 25.8 07/19/2018 1658   TCO2 27 07/14/2018 1658   O2SAT 99.0 08/07/2018 1658     Coagulation Profile: Recent Labs  Lab 07/14/2018 1552 08/12/18 0352  INR 1.1 1.3*    Cardiac Enzymes: No results for input(s): CKTOTAL, CKMB, CKMBINDEX, TROPONINI in the last 168 hours.  HbA1C: Hgb A1c MFr Bld  Date/Time Value Ref Range Status  07/04/2018 07:46 AM 6.8 (H) 4.8 - 5.6 % Final    Comment:    (NOTE)         Prediabetes: 5.7 - 6.4         Diabetes: >6.4         Glycemic control for adults with diabetes: <7.0     CBG: Recent Labs  Lab  08/13/18 0016 08/13/18 0433 08/13/18 0742 08/13/18 1157 08/13/18 1537  GLUCAP 167* 160* 185* 182* 185*    Garner Nash, DO [facility] 08/13/2018 4:40 PM  Personal pager: [phone] If unanswered, please page CCM On-call: [phone]

## 2018-08-13 NOTE — Progress Notes (Signed)
PROGRESS NOTE    Corretta Bell  VEH:[id number] DOB: 20-Jun-1940 DOA: 08/06/2018 PCP: Gilford Silvius, MD   Brief Narrative: Patient is a 78 year old female with history of chronic hypoxic respiratory failure on trach collar at [facility], A. fib, history of aortic valve endocarditis, CKD stage IV, diabetes mellitus, hypertension, congestive heart failure, history of pulmonary hemorrhage, UTIs who was sent from [facility] due to desaturation.  She was sent to the emergency department on vent.  Nursing staff reported that she had increased bilateral lower extremity edema including anasarca.  She was not on diuretics on [facility].  Patient was admitted under PCCM service.  She was diuresed with IV Lasix.  Patient handed over to [facility] on 08/11/2018.Currently on Trach Collar.  08/12/18: It was reported that she had increased work of breathing last night with increased respiratory and heart rate.  Saturation dropped to high 80s.  She was put on ventilator.  Back to trach collar this morning Increased oozing from ecchymotic area on the right upper extremity noted.  Wound care consulted.  Patient is still having severe anasarca.  Started on Lasix 40 mg IV twice daily.DC canceled.  08/13/18: Patient is still volume overloaded this morning.  She is on trach collar at 8 L/min of oxygen.  Currently not  stable for discharge today.  Increased the dose of Lasix to 60 mg twice a day.  Needs suctioning of the trach.  Found to be tachycardic.  Assessment & Plan:   Active Problems:   Acute respiratory failure (HCC)   Acute on chronic hypoxic respiratory failure: Most likely secondary to volume overload secondary to congestive heart failure. She is on trach collar on baseline.   It was reported that she has increased work  breathingwith increased respiratory and heart rate.  Currently on trach but is on 8 L of oxygen per minute.  Suspected pneumonia: Initially Started on vancomycin and meropenem.  She has history of  enterococcus bacteremia and also drug-resistant UTI.  Cultures have been sent.  So far negative.  She was started on cefepime at [facility] on 08/06/2018.  Discontinued the antibiotics .  Acute on chronic congestive heart failure : Echocardiogram done here showed reduced ejection fraction of 35 to 40%. Likely chronic finding. Patient not on Lasix at baseline.  Started on IV Lasix.  Continue carvedilol.  Will discontinue Cardizem.  Not started on ACE/ARB due to AKI. Still volume overloaded today.  Started on Lasix 60 mg twice daily IV  History of recent pulmonary hemorrhage: Reported at outside hospital, prior hospitalization on 6/20.  On Eliquis which has been held due to thrombocytopenia, ecchymosis  Hyponatremia/hypokalemia: Improved  Normocytic anemia: Stable.  Continue to monitor CBC.  Thrombocytopenia: Has diffusely scattered petechiae and ecchymosis.  Eliquis has been held.  Smear without schistocytes.  AKI on CKD stage III: Kidney function improved with diuresis.  Currently kidney function close to baseline  Hyperlipidemia: On Lipitor, gemfibrozil  Glaucoma: Continue eyedrops  Chronic A. fib: Currently rate is controlled.  Continue carvedilol.  Eliquis held due to anemia, thrombocytopenia and petechiae/ecchymosis  Diabetes mellitus: Continue insulin as ordered.  Diabetic currently following  Hypertension: On Coreg, hydralazine and Cardizem.BP stable  Nutrition Problem: Inadequate oral intake Etiology: acute illness      DVT prophylaxis: SCD Code Status: partial Family Communication: Discussed with husband on phone on 7/3 Disposition Plan: To [facility] when hemodynamically stable  Consultants: PCCM  Procedures: None Antimicrobials:  Anti-infectives (From admission, onward)   Start     Dose/Rate Route  Frequency Ordered Stop   08/10/18 2200  vancomycin (VANCOCIN) 1,250 mg in sodium chloride 0.9 % 250 mL IVPB  Status:  Discontinued     1,250 mg 166.7 mL/hr over 90 Minutes  Intravenous Every 24 hours 08/02/2018 2228 08/12/18 1344   07/19/2018 2230  vancomycin (VANCOCIN) 1,500 mg in sodium chloride 0.9 % 500 mL IVPB     1,500 mg 250 mL/hr over 120 Minutes Intravenous  Once 07/25/2018 2228 08/10/18 0235   07/28/2018 2230  meropenem (MERREM) 1 g in sodium chloride 0.9 % 100 mL IVPB  Status:  Discontinued     1 g 200 mL/hr over 30 Minutes Intravenous Every 12 hours 07/15/2018 2228 08/12/18 1344       Objective: Vitals:   08/13/18 1019 08/13/18 1034 08/13/18 1035 08/13/18 1037  BP:    111/90  Pulse:      Resp:      Temp:      TempSrc:      SpO2: 92% 93% 91%   Weight:      Height:        Intake/Output Summary (Last 24 hours) at 08/13/2018 1058 Last data filed at 08/13/2018 1000 Gross per 24 hour  Intake 1535 ml  Output 1175 ml  Net 360 ml   Filed Weights   08/11/18 0500 08/12/18 0400 08/13/18 0500  Weight: 105.4 kg 107.3 kg 106.8 kg    Examination:   General exam: Chronically looking, morbidly  obese, in mild to moderate respiratory distress due to secretions on the trach collar  HEENT:PERRL,Oral mucosa moist, Ear/Nose normal on gross exam Respiratory system: Bilateral decreased air entry on bases Cardiovascular system: A. fib. No JVD, murmurs, rubs, gallops or clicks. Gastrointestinal system: Abdomen is nondistended, soft and nontender. No organomegaly or masses felt. Normal bowel sounds heard.  PEG Central nervous system: Not alert and oriented.  Extremities:Ansarca, scattered ecchymosis, petechiae Skin:  Diffuse ecchymosis, petechiae, oozing from the right upper extremity    Data Reviewed: I have personally reviewed following labs and imaging studies  CBC: Recent Labs  Lab 08/01/2018 1552 07/16/2018 1658 08/10/18 0505 08/11/18 0446  WBC 11.3*  --  11.6* 13.7*  NEUTROABS 9.6*  --   --   --   HGB 8.2* 8.5* 8.2* 8.3*  HCT 26.6* 25.0* 25.9* 26.6*  MCV 96.4  --  94.9 96.4  PLT 95*  --  81* 88*   Basic Metabolic Panel: Recent Labs  Lab 07/27/2018  1552 08/02/2018 1658 08/10/18 0505 08/10/18 1600 08/11/18 0446 08/11/18 2244 08/12/18 0352 08/13/18 0523  NA 130* 132* 134*  --  130*  --  133* 135  K 3.7 3.7 3.4*  --  3.6  --  4.2 4.3  CL 96*  --  95*  --  96*  --  95* 97*  CO2 25  --  27  --  27  --  28 27  GLUCOSE 138*  --  80  --  281*  --  162* 188*  BUN 87*  --  86*  --  88*  --  95* 107*  CREATININE 1.60*  --  1.51*  --  1.49*  --  1.50* 1.59*  CALCIUM 8.5*  --  8.5*  --  8.2*  --  8.4* 8.5*  MG  --   --  2.4 2.4 2.1 2.2  --   --   PHOS  --   --  3.4 3.7 3.9  --   --   --  GFR: Estimated Creatinine Clearance: 34.6 mL/min (A) (by C-G formula based on SCr of 1.59 mg/dL (H)). Liver Function Tests: Recent Labs  Lab 08/01/2018 1552  AST 30  ALT 24  ALKPHOS 154*  BILITOT 1.1  PROT 5.0*  ALBUMIN 1.9*   No results for input(s): LIPASE, AMYLASE in the last 168 hours. No results for input(s): AMMONIA in the last 168 hours. Coagulation Profile: Recent Labs  Lab 08/07/2018 1552 08/12/18 0352  INR 1.1 1.3*   Cardiac Enzymes: No results for input(s): CKTOTAL, CKMB, CKMBINDEX, TROPONINI in the last 168 hours. BNP (last 3 results) No results for input(s): PROBNP in the last 8760 hours. HbA1C: No results for input(s): HGBA1C in the last 72 hours. CBG: Recent Labs  Lab 08/12/18 1521 08/12/18 1951 08/13/18 0016 08/13/18 0433 08/13/18 0742  GLUCAP 159* 150* 167* 160* 185*   Lipid Profile: No results for input(s): CHOL, HDL, LDLCALC, TRIG, CHOLHDL, LDLDIRECT in the last 72 hours. Thyroid Function Tests: No results for input(s): TSH, T4TOTAL, FREET4, T3FREE, THYROIDAB in the last 72 hours. Anemia Panel: No results for input(s): VITAMINB12, FOLATE, FERRITIN, TIBC, IRON, RETICCTPCT in the last 72 hours. Sepsis Labs: Recent Labs  Lab 07/27/2018 1552  LATICACIDVEN 1.1    Recent Results (from the past 240 hour(s))  Urine Culture     Status: Abnormal   Collection Time: 08/08/2018  4:15 PM   Specimen: Urine, Catheterized   Result Value Ref Range Status   Specimen Description URINE, CATHETERIZED  Final   Special Requests   Final    NONE Performed at [facility], [address] [place], [place] [address]    Culture 80,000 COLONIES/mL YEAST (A)  Final   Report Status 08/11/2018 FINAL  Final  SARS Coronavirus 2 (CEPHEID - Performed in [place] hospital lab), Hosp Order     Status: None   Collection Time: 07/12/2018  4:42 PM   Specimen: Nasopharyngeal Swab  Result Value Ref Range Status   SARS Coronavirus 2 NEGATIVE NEGATIVE Final    Comment: (NOTE) If result is NEGATIVE SARS-CoV-2 target nucleic acids are NOT DETECTED. The SARS-CoV-2 RNA is generally detectable in upper and lower  respiratory specimens during the acute phase of infection. The lowest  concentration of SARS-CoV-2 viral copies this assay can detect is 250  copies / mL. A negative result does not preclude SARS-CoV-2 infection  and should not be used as the sole basis for treatment or other  patient management decisions.  A negative result may occur with  improper specimen collection / handling, submission of specimen other  than nasopharyngeal swab, presence of viral mutation(s) within the  areas targeted by this assay, and inadequate number of viral copies  (<250 copies / mL). A negative result must be combined with clinical  observations, patient history, and epidemiological information. If result is POSITIVE SARS-CoV-2 target nucleic acids are DETECTED. The SARS-CoV-2 RNA is generally detectable in upper and lower  respiratory specimens dur ing the acute phase of infection.  Positive  results are indicative of active infection with SARS-CoV-2.  Clinical  correlation with patient history and other diagnostic information is  necessary to determine patient infection status.  Positive results do  not rule out bacterial infection or co-infection with other viruses. If result is PRESUMPTIVE POSTIVE SARS-CoV-2 nucleic acids MAY BE  PRESENT.   A presumptive positive result was obtained on the submitted specimen  and confirmed on repeat testing.  While 2019 novel coronavirus  (SARS-CoV-2) nucleic acids may be present  in the submitted sample  additional confirmatory testing may be necessary for epidemiological  and / or clinical management purposes  to differentiate between  SARS-CoV-2 and other Sarbecovirus currently known to infect humans.  If clinically indicated additional testing with an alternate test  methodology [phone]) is advised. The SARS-CoV-2 RNA is generally  detectable in upper and lower respiratory sp ecimens during the acute  phase of infection. The expected result is Negative. Fact Sheet for Patients:  StrictlyIdeas.no Fact Sheet for Healthcare Providers: BankingDealers.co.za This test is not yet approved or cleared by the [place] FDA and has been authorized for detection and/or diagnosis of SARS-CoV-2 by FDA under an Emergency Use Authorization (EUA).  This EUA will remain in effect (meaning this test can be used) for the duration of the COVID-19 declaration under Section 564(b)(1) of the Act, 21 U.S.C. section 360bbb-3(b)(1), unless the authorization is terminated or revoked sooner. Performed at [facility], [place] [address] [place], [place] [address]   MRSA PCR Screening     Status: None   Collection Time: 08/10/18 12:16 AM   Specimen: Nasal Mucosa; Nasopharyngeal  Result Value Ref Range Status   MRSA by PCR NEGATIVE NEGATIVE Final    Comment:        The GeneXpert MRSA Assay (FDA approved for NASAL specimens only), is one component of a comprehensive MRSA colonization surveillance program. It is not intended to diagnose MRSA infection nor to guide or monitor treatment for MRSA infections. Performed at [facility], [place] [address], [place], [place] [address]   Culture, blood (Routine X 2) w Reflex to ID Panel     Status:  None (Preliminary result)   Collection Time: 08/10/18  5:03 AM   Specimen: BLOOD RIGHT HAND  Result Value Ref Range Status   Specimen Description BLOOD RIGHT HAND  Final   Special Requests   Final    BOTTLES DRAWN AEROBIC ONLY Blood Culture results may not be optimal due to an inadequate volume of blood received in culture bottles   Culture   Final    NO GROWTH 2 DAYS Performed at [facility], [place] [address] [place], [place] [address]    Report Status PENDING  Incomplete  Culture, blood (routine x 2)     Status: None (Preliminary result)   Collection Time: 08/10/18  5:05 AM   Specimen: BLOOD  Result Value Ref Range Status   Specimen Description BLOOD PICC LINE  Final   Special Requests   Final    BOTTLES DRAWN AEROBIC ONLY Blood Culture results may not be optimal due to an inadequate volume of blood received in culture bottles   Culture   Final    NO GROWTH 2 DAYS Performed at [facility], [name] [address] [place], [place] [address]    Report Status PENDING  Incomplete         Radiology Studies: No results found.      Scheduled Meds: . amantadine  100 mg Per Tube Daily  . carvedilol  12.5 mg Oral BID WC  . chlorhexidine  5 mL Mouth/Throat BID  . chlorhexidine gluconate (MEDLINE KIT)  15 mL Mouth Rinse BID  . Chlorhexidine Gluconate Cloth  6 each Topical Daily  . cholecalciferol  1,000 Units Per Tube Daily  . citalopram  10 mg Per Tube Daily  . clonazePAM  0.25 mg Per Tube BID  . famotidine  20 mg Per Tube QHS  . feeding supplement (PRO-STAT SUGAR  FREE 64)  30 mL Per Tube TID  . folic acid  0.5 mg Per Tube Daily  . furosemide  60 mg Intravenous BID  . hydrALAZINE  25 mg Per Tube BID  . insulin aspart  0-9 Units Subcutaneous Q4H  . insulin aspart  2 Units Subcutaneous Q4H  . latanoprost  1 drop Both Eyes QHS  . loratadine  10 mg Per Tube Daily  . mouth rinse  15 mL Mouth Rinse 10 times per day  . modafinil  100 mg Per Tube Daily  . polyethylene  glycol  17 g Per Tube Daily  . QUEtiapine  50 mg Per Tube BID  . timolol  1 drop Both Eyes BID   Continuous Infusions: . feeding supplement (OSMOLITE 1.2 CAL) 60 mL/hr at 08/13/18 1000     LOS: 4 days    Time spent: 35 mins.      Shelly Coss, MD [facility] Pager [phone]  If 7PM-7AM, please contact night-coverage [email or web address] Password [email or web address] 08/13/2018, 10:58 AM

## 2018-08-14 ENCOUNTER — Inpatient Hospital Stay (HOSPITAL_COMMUNITY): Payer: Medicare Other

## 2018-08-14 DIAGNOSIS — L8990 Pressure ulcer of unspecified site, unspecified stage: Secondary | ICD-10-CM | POA: Insufficient documentation

## 2018-08-14 DIAGNOSIS — I5033 Acute on chronic diastolic (congestive) heart failure: Secondary | ICD-10-CM

## 2018-08-14 LAB — CBC WITH DIFFERENTIAL/PLATELET
Abs Immature Granulocytes: 0.13 10*3/uL — ABNORMAL HIGH (ref 0.00–0.07)
Basophils Absolute: 0 10*3/uL (ref 0.0–0.1)
Basophils Relative: 0 %
Eosinophils Absolute: 0.4 10*3/uL (ref 0.0–0.5)
Eosinophils Relative: 4 %
HCT: 24.7 % — ABNORMAL LOW (ref 36.0–46.0)
Hemoglobin: 7.6 g/dL — ABNORMAL LOW (ref 12.0–15.0)
Immature Granulocytes: 1 %
Lymphocytes Relative: 10 %
Lymphs Abs: 0.9 10*3/uL (ref 0.7–4.0)
MCH: 30.5 pg (ref 26.0–34.0)
MCHC: 30.8 g/dL (ref 30.0–36.0)
MCV: 99.2 fL (ref 80.0–100.0)
Monocytes Absolute: 0.3 10*3/uL (ref 0.1–1.0)
Monocytes Relative: 4 %
Neutro Abs: 7.3 10*3/uL (ref 1.7–7.7)
Neutrophils Relative %: 81 %
Platelets: 116 10*3/uL — ABNORMAL LOW (ref 150–400)
RBC: 2.49 MIL/uL — ABNORMAL LOW (ref 3.87–5.11)
RDW: 18 % — ABNORMAL HIGH (ref 11.5–15.5)
WBC: 9 10*3/uL (ref 4.0–10.5)
nRBC: 0 % (ref 0.0–0.2)

## 2018-08-14 LAB — BASIC METABOLIC PANEL
Anion gap: 8 (ref 5–15)
Anion gap: 11 (ref 5–15)
BUN: 115 mg/dL — ABNORMAL HIGH (ref 8–23)
BUN: 119 mg/dL — ABNORMAL HIGH (ref 8–23)
CO2: 29 mmol/L (ref 22–32)
CO2: 29 mmol/L (ref 22–32)
Calcium: 8.6 mg/dL — ABNORMAL LOW (ref 8.9–10.3)
Calcium: 8.7 mg/dL — ABNORMAL LOW (ref 8.9–10.3)
Chloride: 99 mmol/L (ref 98–111)
Chloride: 97 mmol/L — ABNORMAL LOW (ref 98–111)
Creatinine, Ser: 1.52 mg/dL — ABNORMAL HIGH (ref 0.44–1.00)
Creatinine, Ser: 1.66 mg/dL — ABNORMAL HIGH (ref 0.44–1.00)
GFR calc Af Amer: 38 mL/min — ABNORMAL LOW (ref >60)
GFR calc Af Amer: 34 mL/min — ABNORMAL LOW (ref >60)
GFR calc non Af Amer: 33 mL/min — ABNORMAL LOW (ref >60)
GFR calc non Af Amer: 30 mL/min — ABNORMAL LOW (ref >60)
Glucose, Bld: 136 mg/dL — ABNORMAL HIGH (ref 70–99)
Glucose, Bld: 173 mg/dL — ABNORMAL HIGH (ref 70–99)
Potassium: 4.4 mmol/L (ref 3.5–5.1)
Potassium: 4.2 mmol/L (ref 3.5–5.1)
Sodium: 136 mmol/L (ref 135–145)
Sodium: 137 mmol/L (ref 135–145)

## 2018-08-14 LAB — GLUCOSE, CAPILLARY
Glucose-Capillary: 105 mg/dL — ABNORMAL HIGH (ref 70–99)
Glucose-Capillary: 125 mg/dL — ABNORMAL HIGH (ref 70–99)
Glucose-Capillary: 131 mg/dL — ABNORMAL HIGH (ref 70–99)
Glucose-Capillary: 148 mg/dL — ABNORMAL HIGH (ref 70–99)
Glucose-Capillary: 161 mg/dL — ABNORMAL HIGH (ref 70–99)
Glucose-Capillary: 188 mg/dL — ABNORMAL HIGH (ref 70–99)

## 2018-08-14 LAB — EXPECTORATED SPUTUM ASSESSMENT W GRAM STAIN, RFLX TO RESP C

## 2018-08-14 MED ORDER — FUROSEMIDE 10 MG/ML IJ SOLN
80.0000 mg | Freq: Three times a day (TID) | INTRAMUSCULAR | Status: AC
  Administered 2018-08-14 – 2018-08-15 (×3): 80 mg via INTRAVENOUS
  Filled 2018-08-14 (×4): qty 8

## 2018-08-14 MED ORDER — METOLAZONE 5 MG PO TABS
5.0000 mg | ORAL_TABLET | Freq: Once | ORAL | Status: AC
  Administered 2018-08-14: 5 mg via ORAL
  Filled 2018-08-14: qty 1

## 2018-08-14 NOTE — Progress Notes (Signed)
Patient's PEG tube upon initial assessment has a slit in it. Tube feeds were found all in patient's bed. TF stopped. PEG flushed and clamped.

## 2018-08-14 NOTE — Progress Notes (Signed)
Pt's husband called and has spoken to his children. Family members have ability to meet together Tuesday morning and visit w/ their mother/grandmother; I anticipate they will elect to terminally liberate pt from vent after visit.

## 2018-08-14 NOTE — Progress Notes (Signed)
Pt to trach collar this am x 3.5 hours then becoming tachypneic, shallow  with accessory muscle use. Returned to vent support with immediate resolution of distress.

## 2018-08-14 NOTE — Progress Notes (Signed)
PROGRESS NOTE    Loretta Bell  UDJ:[id number] DOB: [birth year] DOA: 08/05/2018 PCP: Gilford Silvius, MD   Brief Narrative: Patient is a 78 year old female with history of chronic hypoxic respiratory failure on trach collar at [facility], A. fib, history of aortic valve endocarditis, CKD stage IV, diabetes mellitus, hypertension, congestive heart failure, history of pulmonary hemorrhage, UTIs who was sent from [facility] due to desaturation.  She was sent to the emergency department on vent.  Nursing staff reported that she had increased bilateral lower extremity edema including anasarca.  She was not on diuretics on [facility].  Patient was admitted under PCCM service.  She was diuresed with IV Lasix.  Patient handed over to [facility] on 08/11/2018.Currently on Trach Collar.  08/12/18: It was reported that she had increased work of breathing last night with increased respiratory and heart rate.  Saturation dropped to high 80s.  She was put on ventilator.  Back to trach collar this morning Increased oozing from ecchymotic area on the right upper extremity noted.  Wound care consulted.  Patient is still having severe anasarca.  Started on Lasix 40 mg IV twice daily.DC canceled.  08/13/18: Patient is still volume overloaded this morning.  She is on trach collar at 8 L/min of oxygen.  Currently not  stable for discharge today.  Increased the dose of Lasix to 60 mg twice a day.  Needs suctioning of the trach.  Found to be tachycardic.  7/5/20Martin Majestic into respiratory distress again this morning.  Had to be put on vent.  Husband came to visit her today.  Talked to the husband in person.  She is continued on IV diuresis.  Due to increased work of breathing, she had to be put back on ventilator.  Patient is DNR now.  Palliative care has been consulted.  Tracheal aspirate revealed thick purulent mucus.  Chest x-ray did not show pneumonia and showed improved pulmonary edema.  Sputum culture sent.  Assessment & Plan:    Active Problems:   Acute respiratory failure (HCC)   Pressure injury of skin   Acute on chronic hypoxic respiratory failure: Most likely secondary to volume overload secondary to congestive heart failure. She is on trach collar on baseline. On vent now.   Suspected pneumonia: Initially Started on vancomycin and meropenem.  She has history of enterococcus bacteremia and also drug-resistant UTI.  Cultures have been sent.  So far negative.  She was started on cefepime at [facility] on 08/06/2018.  Discontinued the antibiotics .Follow up sputum culture.  Acute on chronic congestive heart failure : Echocardiogram done here showed reduced ejection fraction of 35 to 40%. Likely chronic finding. Patient not on Lasix at baseline.    Continue carvedilol.  Discontinued Cardizem.  Not started on ACE/ARB due to AKI. Still volume overloaded today.  Started on Lasix  IV,given a dose of metalazone.  History of recent pulmonary hemorrhage: Reported at outside hospital, prior hospitalization on 6/20.  On Eliquis which has been held due to thrombocytopenia, ecchymosis  Hyponatremia/hypokalemia: Improved  Normocytic anemia: Stable.  Continue to monitor CBC.  Thrombocytopenia: Has diffusely scattered petechiae and ecchymosis.  Eliquis has been held.  Smear without schistocytes.  AKI on CKD stage III: Kidney function improved with diuresis.  Currently kidney function close to baseline  Hyperlipidemia: On Lipitor, gemfibrozil  Glaucoma: Continue eyedrops  Chronic A. fib: Currently rate is controlled.  Continue carvedilol.  Eliquis held due to anemia, thrombocytopenia and petechiae/ecchymosis.S/P pacemaker.  Diabetes mellitus: Continue insulin as ordered.  Diabetic  currently following  Hypertension: On Coreg, hydralazine and Cardizem.BP stable  Nutrition Problem: Inadequate oral intake Etiology: acute illness      DVT prophylaxis: SCD Code Status: partial Family Communication: Discussed with husband on  phone on 7/3 Disposition Plan: To [facility] when hemodynamically stable  Consultants: PCCM  Procedures: None Antimicrobials:  Anti-infectives (From admission, onward)   Start     Dose/Rate Route Frequency Ordered Stop   08/10/18 2200  vancomycin (VANCOCIN) 1,250 mg in sodium chloride 0.9 % 250 mL IVPB  Status:  Discontinued     1,250 mg 166.7 mL/hr over 90 Minutes Intravenous Every 24 hours 07/28/2018 2228 08/12/18 1344   08/06/2018 2230  vancomycin (VANCOCIN) 1,500 mg in sodium chloride 0.9 % 500 mL IVPB     1,500 mg 250 mL/hr over 120 Minutes Intravenous  Once 08/07/2018 2228 08/10/18 0235   07/16/2018 2230  meropenem (MERREM) 1 g in sodium chloride 0.9 % 100 mL IVPB  Status:  Discontinued     1 g 200 mL/hr over 30 Minutes Intravenous Every 12 hours 07/25/2018 2228 08/12/18 1344       Objective: Vitals:   08/14/18 1100 08/14/18 1146 08/14/18 1149 08/14/18 1200  BP: 120/79 120/79  (!) 137/93  Pulse: 73 78  (!) 123  Resp: '16 16  16  '$ Temp:   98.4 F (36.9 C)   TempSrc:   Axillary   SpO2: 99% 98%  99%  Weight:      Height:        Intake/Output Summary (Last 24 hours) at 08/14/2018 1333 Last data filed at 08/14/2018 1200 Gross per 24 hour  Intake 2485 ml  Output 1835 ml  Net 650 ml   Filed Weights   08/11/18 0500 08/12/18 0400 08/13/18 0500  Weight: 105.4 kg 107.3 kg 106.8 kg    Examination:   General exam: Chronically looking, morbidly  obese, in mild to moderate respiratory distress due to secretions on the trach collar  HEENT:PERRL,Oral mucosa moist, Ear/Nose normal on gross exam Respiratory system: Bilateral decreased air entry on bases Cardiovascular system: A. fib. No JVD, murmurs, rubs, gallops or clicks. Gastrointestinal system: Abdomen is nondistended, soft and nontender. No organomegaly or masses felt. Normal bowel sounds heard.  PEG Central nervous system: Not alert and oriented.  Extremities:Ansarca, scattered ecchymosis, petechiae Skin:  Diffuse ecchymosis,  petechiae, oozing from the right upper extremity    Data Reviewed: I have personally reviewed following labs and imaging studies  CBC: Recent Labs  Lab 07/17/2018 1552 08/07/2018 1658 08/10/18 0505 08/11/18 0446 08/14/18 0305  WBC 11.3*  --  11.6* 13.7* 9.0  NEUTROABS 9.6*  --   --   --  7.3  HGB 8.2* 8.5* 8.2* 8.3* 7.6*  HCT 26.6* 25.0* 25.9* 26.6* 24.7*  MCV 96.4  --  94.9 96.4 99.2  PLT 95*  --  81* 88* 881*   Basic Metabolic Panel: Recent Labs  Lab 08/10/18 0505 08/10/18 1600 08/11/18 0446 08/11/18 2244 08/12/18 0352 08/13/18 0523 08/14/18 0305  NA 134*  --  130*  --  133* 135 136  K 3.4*  --  3.6  --  4.2 4.3 4.4  CL 95*  --  96*  --  95* 97* 99  CO2 27  --  27  --  '28 27 29  '$ GLUCOSE 80  --  281*  --  162* 188* 136*  BUN 86*  --  88*  --  95* 107* 115*  CREATININE 1.51*  --  1.49*  --  1.50* 1.59* 1.52*  CALCIUM 8.5*  --  8.2*  --  8.4* 8.5* 8.6*  MG 2.4 2.4 2.1 2.2  --   --   --   PHOS 3.4 3.7 3.9  --   --   --   --    GFR: Estimated Creatinine Clearance: 36.2 mL/min (A) (by C-G formula based on SCr of 1.52 mg/dL (H)). Liver Function Tests: Recent Labs  Lab 07/21/2018 1552  AST 30  ALT 24  ALKPHOS 154*  BILITOT 1.1  PROT 5.0*  ALBUMIN 1.9*   No results for input(s): LIPASE, AMYLASE in the last 168 hours. No results for input(s): AMMONIA in the last 168 hours. Coagulation Profile: Recent Labs  Lab 07/16/2018 1552 08/12/18 0352  INR 1.1 1.3*   Cardiac Enzymes: No results for input(s): CKTOTAL, CKMB, CKMBINDEX, TROPONINI in the last 168 hours. BNP (last 3 results) No results for input(s): PROBNP in the last 8760 hours. HbA1C: No results for input(s): HGBA1C in the last 72 hours. CBG: Recent Labs  Lab 08/13/18 1911 08/13/18 2332 08/14/18 0352 08/14/18 0742 08/14/18 1152  GLUCAP 152* 133* 131* 148* 188*   Lipid Profile: No results for input(s): CHOL, HDL, LDLCALC, TRIG, CHOLHDL, LDLDIRECT in the last 72 hours. Thyroid Function Tests: No  results for input(s): TSH, T4TOTAL, FREET4, T3FREE, THYROIDAB in the last 72 hours. Anemia Panel: No results for input(s): VITAMINB12, FOLATE, FERRITIN, TIBC, IRON, RETICCTPCT in the last 72 hours. Sepsis Labs: Recent Labs  Lab 07/28/2018 1552  LATICACIDVEN 1.1    Recent Results (from the past 240 hour(s))  Urine Culture     Status: Abnormal   Collection Time: 08/03/2018  4:15 PM   Specimen: Urine, Catheterized  Result Value Ref Range Status   Specimen Description URINE, CATHETERIZED  Final   Special Requests   Final    NONE Performed at [facility], [address], [place], [place] [address]    Culture 80,000 COLONIES/mL YEAST (A)  Final   Report Status 08/11/2018 FINAL  Final  SARS Coronavirus 2 (CEPHEID - Performed in [place] hospital lab), Hosp Order     Status: None   Collection Time: 07/23/2018  4:42 PM   Specimen: Nasopharyngeal Swab  Result Value Ref Range Status   SARS Coronavirus 2 NEGATIVE NEGATIVE Final    Comment: (NOTE) If result is NEGATIVE SARS-CoV-2 target nucleic acids are NOT DETECTED. The SARS-CoV-2 RNA is generally detectable in upper and lower  respiratory specimens during the acute phase of infection. The lowest  concentration of SARS-CoV-2 viral copies this assay can detect is 250  copies / mL. A negative result does not preclude SARS-CoV-2 infection  and should not be used as the sole basis for treatment or other  patient management decisions.  A negative result may occur with  improper specimen collection / handling, submission of specimen other  than nasopharyngeal swab, presence of viral mutation(s) within the  areas targeted by this assay, and inadequate number of viral copies  (<250 copies / mL). A negative result must be combined with clinical  observations, patient history, and epidemiological information. If result is POSITIVE SARS-CoV-2 target nucleic acids are DETECTED. The SARS-CoV-2 RNA is generally detectable in upper and lower   respiratory specimens dur ing the acute phase of infection.  Positive  results are indicative of active infection with SARS-CoV-2.  Clinical  correlation with patient history and other diagnostic information is  necessary to determine patient infection status.  Positive results do  not  rule out bacterial infection or co-infection with other viruses. If result is PRESUMPTIVE POSTIVE SARS-CoV-2 nucleic acids MAY BE PRESENT.   A presumptive positive result was obtained on the submitted specimen  and confirmed on repeat testing.  While 2019 novel coronavirus  (SARS-CoV-2) nucleic acids may be present in the submitted sample  additional confirmatory testing may be necessary for epidemiological  and / or clinical management purposes  to differentiate between  SARS-CoV-2 and other Sarbecovirus currently known to infect humans.  If clinically indicated additional testing with an alternate test  methodology [phone]) is advised. The SARS-CoV-2 RNA is generally  detectable in upper and lower respiratory sp ecimens during the acute  phase of infection. The expected result is Negative. Fact Sheet for Patients:  StrictlyIdeas.no Fact Sheet for Healthcare Providers: BankingDealers.co.za This test is not yet approved or cleared by the [place] FDA and has been authorized for detection and/or diagnosis of SARS-CoV-2 by FDA under an Emergency Use Authorization (EUA).  This EUA will remain in effect (meaning this test can be used) for the duration of the COVID-19 declaration under Section 564(b)(1) of the Act, 21 U.S.C. section 360bbb-3(b)(1), unless the authorization is terminated or revoked sooner. Performed at [facility], [place] [address] [place], [place] [address]   MRSA PCR Screening     Status: None   Collection Time: 08/10/18 12:16 AM   Specimen: Nasal Mucosa; Nasopharyngeal  Result Value Ref Range Status   MRSA by PCR NEGATIVE  NEGATIVE Final    Comment:        The GeneXpert MRSA Assay (FDA approved for NASAL specimens only), is one component of a comprehensive MRSA colonization surveillance program. It is not intended to diagnose MRSA infection nor to guide or monitor treatment for MRSA infections. Performed at [facility], [place] [address] [place], [place] [address]   Culture, blood (Routine X 2) w Reflex to ID Panel     Status: None (Preliminary result)   Collection Time: 08/10/18  5:03 AM   Specimen: BLOOD RIGHT HAND  Result Value Ref Range Status   Specimen Description BLOOD RIGHT HAND  Final   Special Requests   Final    BOTTLES DRAWN AEROBIC ONLY Blood Culture results may not be optimal due to an inadequate volume of blood received in culture bottles   Culture   Final    NO GROWTH 3 DAYS Performed at [facility], [place] [address] [place], [place] [address]    Report Status PENDING  Incomplete  Culture, blood (routine x 2)     Status: None (Preliminary result)   Collection Time: 08/10/18  5:05 AM   Specimen: BLOOD  Result Value Ref Range Status   Specimen Description BLOOD PICC LINE  Final   Special Requests   Final    BOTTLES DRAWN AEROBIC ONLY Blood Culture results may not be optimal due to an inadequate volume of blood received in culture bottles   Culture   Final    NO GROWTH 3 DAYS Performed at [facility], [place] [address], [place], [place] [address]    Report Status PENDING  Incomplete         Radiology Studies: Dg Chest 1 View  Result Date: 08/14/2018 CLINICAL DATA:  Shortness of breath EXAM: CHEST  1 VIEW COMPARISON:  08/08/2018 FINDINGS: Chronic cardiomegaly. Dual lead pacemaker appears the same. Pulmonary venous hypertension without frank edema. Left effusion with left base atelectasis and or pneumonia. IMPRESSION: Radiographic improvement.  Resolution of pulmonary edema. Pulmonary venous hypertension persists. Cardiomegaly. Left effusion and left base  atelectasis and or pneumonia Electronically Signed   By: Nelson Chimes M.D.   On: 08/14/2018 09:45        Scheduled Meds: . amantadine  100 mg Per Tube Daily  . carvedilol  12.5 mg Oral BID WC  . chlorhexidine gluconate (MEDLINE KIT)  15 mL Mouth Rinse BID  . Chlorhexidine Gluconate Cloth  6 each Topical Daily  . cholecalciferol  1,000 Units Per Tube Daily  . citalopram  10 mg Per Tube Daily  . clonazePAM  0.25 mg Per Tube BID  . famotidine  20 mg Per Tube QHS  . feeding supplement (PRO-STAT SUGAR FREE 64)  30 mL Per Tube TID  . folic acid  0.5 mg Per Tube Daily  . furosemide  80 mg Intravenous Q8H  . hydrALAZINE  25 mg Per Tube BID  . insulin aspart  0-9 Units Subcutaneous Q4H  . insulin aspart  2 Units Subcutaneous Q4H  . latanoprost  1 drop Both Eyes QHS  . loratadine  10 mg Per Tube Daily  . mouth rinse  15 mL Mouth Rinse 10 times per day  . modafinil  100 mg Per Tube Daily  . polyethylene glycol  17 g Per Tube Daily  . QUEtiapine  50 mg Per Tube BID  . timolol  1 drop Both Eyes BID   Continuous Infusions: . sodium chloride 10 mL/hr at 08/14/18 1100  . feeding supplement (OSMOLITE 1.2 CAL) 60 mL/hr at 08/14/18 1100     LOS: 5 days    Time spent: 35 mins.      Shelly Coss, MD [facility] Pager [phone]  If 7PM-7AM, please contact night-coverage [email or web address] Password [email or web address] 08/14/2018, 1:33 PM

## 2018-08-14 NOTE — Progress Notes (Signed)
NAME:  Loretta Bell, MRN:  [id number], DOB:  Jun 01, 1940, LOS: 5 ADMISSION DATE:  07/23/2018, CONSULTATION DATE:  08/08/18 REFERRING MD:  ED, CHIEF COMPLAINT:  hypoxemia  Brief History   Ms. Grennan is a 78 year old woman with a hx of chronic respiratory failure (cause unk to me) (on trach collar at [facility]), atrial fibrillation, hx aortic valve endocarditis, CKD Stage IV, DM, HTN, CHF, reported hx of pulmonary hemorrhage, UTIs.   Sent to hospital from [facility] due to acute low oxygen saturation x 1 day.  Was placed back on vent and sent to ED, sat improved. Staff reportedly noted increasing pitting edema in BUE and BLE, anasarca. "worsening renal function" also noted. Baseline mental status - opens eyes to sound.    History of present illness   Recently seen in ER (07/29/18) for hypothermia, found to have UTI.  Started on cipro, changed to mero due to klebsiella.  Vanc recommended due to enterococcus in blood culture.   Past Medical History  chronic respiratory failure (cause unk to me) (on trach collar at [facility]), atrial fibrillation, CKD Stage IV, DM, HTN, HFpEF, reported hx of pulmonary hemorrhage, UTIs.  Hx aortic valve endocarditis (MSSA) Allergic to Cefazolin, Saxagliptin, Sulfamethoxazole, and Zosyn [piperacillin sod-tazobactam so]  Significant Hospital Events     Consults:    Procedures:    Significant Diagnostic Tests:    Micro Data:  Urine culture 6/30  Blood culture 6/30  Antimicrobials:  Meropenem Started PTA (6/23?) Vancomycin  08/10/18  Interim history/subjective:   No issues overnight night. Still on vent. Did not tolerate TCT.   Objective   Blood pressure 120/79, pulse 73, temperature 98.6 F (37 C), temperature source Axillary, resp. rate 16, height 5\' 2"  (1.575 m), weight 106.8 kg, SpO2 99 %.    Vent Mode: PRVC FiO2 (%):  [30 %-40 %] 30 % Set Rate:  [16 bmp] 16 bmp Vt Set:  [400 mL] 400 mL PEEP:  [5 cmH20] 5 cmH20 Plateau Pressure:  [17  cmH20-25 cmH20] 21 cmH20   Intake/Output Summary (Last 24 hours) at 08/14/2018 1133 Last data filed at 08/14/2018 1000 Gross per 24 hour  Intake 2300 ml  Output 1900 ml  Net 400 ml   Filed Weights   08/11/18 0500 08/12/18 0400 08/13/18 0500  Weight: 105.4 kg 107.3 kg 106.8 kg    Examination: General: obese, minimally responsive  HENT: petechia on face, cheeks, skin Lungs: bilateral vented breaths  Cardiovascular: RRR, s1 s2, no mrg  Abdomen: bs present, nt nd  Extremities: 4+ edema, anasarca  Neuro: opens eyes to pain, spontaneous movements  GU: foley   Resolved Hospital Problem list     Assessment & Plan:   Acute on Chronic Hypoxemic Respiratory Failure requiring tracheostomy and vent dependence  Chronic resp failure, recently weaned off vent this month.  Discharged on 6/19 on trach collar to [facility].  - remains on vent - palliative meeting with family and myself, husband - now DNR - he wants to talk with children  Acute on Chronic Diastolic Heart Failure, HFpEF Bilateral pulm edema and effusions appear stable.  + CFB Anasarca - continue diuresis  - increased lasix and metolazone   Thrombocytopenia ?pulmonary hemorrhage, just documented, unclear if she was ever bronched - continue to observe clinically  Rest per primary team.    Best practice:  Diet: npo  Pain/Anxiety/Delirium protocol (if indicated): NAD VAP protocol (if indicated):  DVT prophylaxis: held  GI prophylaxis: pepcid Glucose control: SSI  Mobility: bedbound  Code Status: DNR Family Communication: I spoke with husband at bedside this AM   Disposition: ICU  Labs   CBC: Recent Labs  Lab 07/17/2018 1552 08/05/2018 1658 08/10/18 0505 08/11/18 0446 08/14/18 0305  WBC 11.3*  --  11.6* 13.7* 9.0  NEUTROABS 9.6*  --   --   --  7.3  HGB 8.2* 8.5* 8.2* 8.3* 7.6*  HCT 26.6* 25.0* 25.9* 26.6* 24.7*  MCV 96.4  --  94.9 96.4 99.2  PLT 95*  --  81* 88* 116*    Basic Metabolic Panel: Recent Labs   Lab 08/10/18 0505 08/10/18 1600 08/11/18 0446 08/11/18 2244 08/12/18 0352 08/13/18 0523 08/14/18 0305  NA 134*  --  130*  --  133* 135 136  K 3.4*  --  3.6  --  4.2 4.3 4.4  CL 95*  --  96*  --  95* 97* 99  CO2 27  --  27  --  28 27 29   GLUCOSE 80  --  281*  --  162* 188* 136*  BUN 86*  --  88*  --  95* 107* 115*  CREATININE 1.51*  --  1.49*  --  1.50* 1.59* 1.52*  CALCIUM 8.5*  --  8.2*  --  8.4* 8.5* 8.6*  MG 2.4 2.4 2.1 2.2  --   --   --   PHOS 3.4 3.7 3.9  --   --   --   --    GFR: Estimated Creatinine Clearance: 36.2 mL/min (A) (by C-G formula based on SCr of 1.52 mg/dL (H)). Recent Labs  Lab 07/11/2018 1552 08/10/18 0505 08/11/18 0446 08/14/18 0305  WBC 11.3* 11.6* 13.7* 9.0  LATICACIDVEN 1.1  --   --   --     Liver Function Tests: Recent Labs  Lab 07/27/2018 1552  AST 30  ALT 24  ALKPHOS 154*  BILITOT 1.1  PROT 5.0*  ALBUMIN 1.9*   No results for input(s): LIPASE, AMYLASE in the last 168 hours. No results for input(s): AMMONIA in the last 168 hours.  ABG    Component Value Date/Time   PHART 7.512 (H) 07/01/2018 1755   PCO2ART 38.8 07/01/2018 1755   PO2ART 71.0 (L) 07/01/2018 1755   HCO3 25.8 07/16/2018 1658   TCO2 27 07/16/2018 1658   O2SAT 99.0 07/24/2018 1658     Coagulation Profile: Recent Labs  Lab 07/23/2018 1552 08/12/18 0352  INR 1.1 1.3*    Cardiac Enzymes: No results for input(s): CKTOTAL, CKMB, CKMBINDEX, TROPONINI in the last 168 hours.  HbA1C: Hgb A1c MFr Bld  Date/Time Value Ref Range Status  07/04/2018 07:46 AM 6.8 (H) 4.8 - 5.6 % Final    Comment:    (NOTE)         Prediabetes: 5.7 - 6.4         Diabetes: >6.4         Glycemic control for adults with diabetes: <7.0     CBG: Recent Labs  Lab 08/13/18 1537 08/13/18 1911 08/13/18 2332 08/14/18 0352 08/14/18 0742  GLUCAP 185* 152* 133* 131* 148*    Darvin Neighbours [facility] 08/14/2018 11:33 AM  Personal pager: [phone] If  unanswered, please page CCM On-call: [phone]

## 2018-08-14 NOTE — Progress Notes (Signed)
Discussed plan of care with pt's husband. He had consented to make the pt a full DNR earlier with Dr. Valeta Harms and I wanted to clarify with him what he anticipated r/t timing of liberating her from the vent for the last time. He repeatedly questioned if we could allow more time for diuretics to encourage diuresis and more opportunity for pt to thrive w/o vent. He desires time to talk w/ family and involve them in this decision. He is anticipating and much desires to speak with palliative team. He remains torn about withdrawing vent support.

## 2018-08-15 LAB — CBC WITH DIFFERENTIAL/PLATELET
Abs Immature Granulocytes: 0.19 10*3/uL — ABNORMAL HIGH (ref 0.00–0.07)
Basophils Absolute: 0 10*3/uL (ref 0.0–0.1)
Basophils Relative: 0 %
Eosinophils Absolute: 0.4 10*3/uL (ref 0.0–0.5)
Eosinophils Relative: 5 %
HCT: 23.3 % — ABNORMAL LOW (ref 36.0–46.0)
Hemoglobin: 7.2 g/dL — ABNORMAL LOW (ref 12.0–15.0)
Immature Granulocytes: 3 %
Lymphocytes Relative: 12 %
Lymphs Abs: 0.9 10*3/uL (ref 0.7–4.0)
MCH: 30.5 pg (ref 26.0–34.0)
MCHC: 30.9 g/dL (ref 30.0–36.0)
MCV: 98.7 fL (ref 80.0–100.0)
Monocytes Absolute: 0.3 10*3/uL (ref 0.1–1.0)
Monocytes Relative: 5 %
Neutro Abs: 5.5 10*3/uL (ref 1.7–7.7)
Neutrophils Relative %: 75 %
Platelets: 128 10*3/uL — ABNORMAL LOW (ref 150–400)
RBC: 2.36 MIL/uL — ABNORMAL LOW (ref 3.87–5.11)
RDW: 18 % — ABNORMAL HIGH (ref 11.5–15.5)
WBC: 7.2 10*3/uL (ref 4.0–10.5)
nRBC: 0.4 % — ABNORMAL HIGH (ref 0.0–0.2)

## 2018-08-15 LAB — VITAMIN C: Vitamin C: 0.9 mg/dL (ref 0.4–2.0)

## 2018-08-15 LAB — GLUCOSE, CAPILLARY
Glucose-Capillary: 101 mg/dL — ABNORMAL HIGH (ref 70–99)
Glucose-Capillary: 118 mg/dL — ABNORMAL HIGH (ref 70–99)
Glucose-Capillary: 137 mg/dL — ABNORMAL HIGH (ref 70–99)
Glucose-Capillary: 156 mg/dL — ABNORMAL HIGH (ref 70–99)
Glucose-Capillary: 172 mg/dL — ABNORMAL HIGH (ref 70–99)
Glucose-Capillary: 98 mg/dL (ref 70–99)

## 2018-08-15 LAB — CULTURE, BLOOD (ROUTINE X 2)
Culture: NO GROWTH
Culture: NO GROWTH

## 2018-08-15 MED ORDER — CHLORHEXIDINE GLUCONATE 0.12 % MT SOLN
OROMUCOSAL | Status: AC
  Filled 2018-08-15: qty 30

## 2018-08-15 NOTE — Progress Notes (Signed)
PROGRESS NOTE    Loretta Bell  WVP:[id number] DOB: 05/31/1940 DOA: 07/28/2018 PCP: Gilford Silvius, MD   Brief Narrative: Patient is a 78 year old female with history of chronic hypoxic respiratory failure on trach collar at [facility], A. fib, history of aortic valve endocarditis, CKD stage IV, diabetes mellitus, hypertension, congestive heart failure, history of pulmonary hemorrhage, UTIs who was sent from [facility] due to desaturation.  She was sent to the emergency department on vent.  Nursing staff reported that she had increased bilateral lower extremity edema including anasarca.  She was not on diuretics on [facility].  Patient was admitted under PCCM service.  She was diuresed with IV Lasix.  Patient handed over to [facility] on 08/11/2018.Currently on Trach Collar.   7/5/20Martin Majestic into respiratory distress again this morning.  Had to be put on vent.  Husband came to visit her today.  Talked to the husband in person.  She is continued on IV diuresis.  Due to increased work of breathing, she had to be put back on ventilator.  Patient is DNR now.  Palliative care has been consulted.  Tracheal aspirate revealed thick purulent mucus.  Chest x-ray did not show pneumonia and showed improved pulmonary edema.  Sputum culture sent.  08/15/18: As per the discussion with husband, patient was made DNR yesterday.  Palliative care having a goal of care meeting on 08/16/2018.  Likely plan for removing the trach and initiating comfort care. She is back to trach collar today.  Currently on 12 L of oxygen per minute.  Looks volume overloaded.  Being given IV Lasix  Assessment & Plan:   Active Problems:   Acute respiratory failure (HCC)   Pressure injury of skin   Acute on chronic hypoxic respiratory failure: Most likely secondary to volume overload secondary to congestive heart failure. She is on trach collar on baseline.    Suspected pneumonia: Initially Started on vancomycin and meropenem.  She has history of  enterococcus bacteremia and also drug-resistant UTI.  Cultures have been sent.  So far negative.  She was started on cefepime at [facility] on 08/06/2018.  Discontinued the antibiotics .Sputum cx sent.  Acute on chronic congestive heart failure : Echocardiogram done here showed reduced ejection fraction of 35 to 40%. Likely chronic finding. Patient not on Lasix at baseline.    Continue carvedilol.  Discontinued Cardizem.  Not started on ACE/ARB due to AKI. Still volume overloaded today.  On IV lasix.Still has poor urine output.  History of recent pulmonary hemorrhage: Reported at outside hospital, prior hospitalization on 6/20.  On Eliquis which has been held due to thrombocytopenia, ecchymosis  Hyponatremia/hypokalemia: Improved  Normocytic anemia: Stable.  Continue to monitor CBC.  Thrombocytopenia: Has diffusely scattered petechiae and ecchymosis.  Eliquis has been held.  Smear without schistocytes.  AKI on CKD stage III: Kidney function improved with diuresis.  Currently kidney function close to baseline  Hyperlipidemia: Was On Lipitor, gemfibrozil  Glaucoma: Continue eyedrops  Chronic A. fib: Currently rate is controlled.  Continue carvedilol.  Eliquis held due to anemia, thrombocytopenia and petechiae/ecchymosis.S/P pacemaker.  Diabetes mellitus: Continue insulin as ordered.   Hypertension: On Coreg, hydralazine and Cardizem.BP stable  Nutrition Problem: Inadequate oral intake Etiology: acute illness      DVT prophylaxis: SCD Code Status: partial Family Communication: Discussed with husband on 08/14/18 Disposition Plan: Goal of care discussion meeting tomorrow with family.  Consultants: PCCM  Procedures: None Antimicrobials:  Anti-infectives (From admission, onward)   Start     Dose/Rate Route  Frequency Ordered Stop   08/10/18 2200  vancomycin (VANCOCIN) 1,250 mg in sodium chloride 0.9 % 250 mL IVPB  Status:  Discontinued     1,250 mg 166.7 mL/hr over 90 Minutes  Intravenous Every 24 hours 07/17/2018 2228 08/12/18 1344   08/02/2018 2230  vancomycin (VANCOCIN) 1,500 mg in sodium chloride 0.9 % 500 mL IVPB     1,500 mg 250 mL/hr over 120 Minutes Intravenous  Once 08/02/2018 2228 08/10/18 0235   08/04/2018 2230  meropenem (MERREM) 1 g in sodium chloride 0.9 % 100 mL IVPB  Status:  Discontinued     1 g 200 mL/hr over 30 Minutes Intravenous Every 12 hours 07/25/2018 2228 08/12/18 1344       Objective: Vitals:   08/15/18 0806 08/15/18 1100 08/15/18 1128 08/15/18 1205  BP:  106/84  107/73  Pulse: 96 (!) 101 (!) 105 (!) 26  Resp: _0 Temp:   98.5 F (36.9 C) 99.2 F (37.3 C)  TempSrc:   Oral Oral  SpO2: 98% 93% 93% 97%  Weight:      Height:        Intake/Output Summary (Last 24 hours) at 08/15/2018 1238 Last data filed at 08/15/2018 1200 Gross per 24 hour  Intake 1106 ml  Output 1795 ml  Net -689 ml   Filed Weights   08/12/18 0400 08/13/18 0500 08/15/18 0600  Weight: 107.3 kg 106.8 kg 105.6 kg    Examination:    General exam: Chronically ill looking, morbidly obese, in mild to moderate respiratory distress  HEENT: Trach collar  respiratory system: Bilateral decreased air entry Cardiovascular system: A. fib. No JVD, murmurs, rubs, gallops or clicks. Gastrointestinal system: Abdomen is nondistended, soft and nontender. No organomegaly or masses felt. Normal bowel sounds heard.  PEG, rectal tube Central nervous system: Not alert and oriented.  Extremities: Anasarca, no clubbing ,no cyanosis, Skin:  Diffuse ecchymosis, petechiae, oozing from the right upper extremity  Data Reviewed: I have personally reviewed following labs and imaging studies  CBC: Recent Labs  Lab 07/28/2018 1552 08/02/2018 1658 08/10/18 0505 08/11/18 0446 08/14/18 0305 08/15/18 0445  WBC 11.3*  --  11.6* 13.7* 9.0 7.2  NEUTROABS 9.6*  --   --   --  7.3 5.5  HGB 8.2* 8.5* 8.2* 8.3* 7.6* 7.2*  HCT 26.6* 25.0* 25.9* 26.6* 24.7* 23.3*  MCV 96.4  --  94.9 96.4 99.2  98.7  PLT 95*  --  81* 88* 116* 401*   Basic Metabolic Panel: Recent Labs  Lab 08/10/18 0505 08/10/18 1600 08/11/18 0446 08/11/18 2244 08/12/18 0352 08/13/18 0523 08/14/18 0305 08/14/18 1438  NA 134*  --  130*  --  133* 135 136 137  K 3.4*  --  3.6  --  4.2 4.3 4.4 4.2  CL 95*  --  96*  --  95* 97* 99 97*  CO2 27  --  27  --  _1 GLUCOSE 80  --  281*  --  162* 188* 136* 173*  BUN 86*  --  88*  --  95* 107* 115* 119*  CREATININE 1.51*  --  1.49*  --  1.50* 1.59* 1.52* 1.66*  CALCIUM 8.5*  --  8.2*  --  8.4* 8.5* 8.6* 8.7*  MG 2.4 2.4 2.1 2.2  --   --   --   --   PHOS 3.4 3.7 3.9  --   --   --   --   --  GFR: Estimated Creatinine Clearance: 32.9 mL/min (A) (by C-G formula based on SCr of 1.66 mg/dL (H)). Liver Function Tests: Recent Labs  Lab 07/27/2018 1552  AST 30  ALT 24  ALKPHOS 154*  BILITOT 1.1  PROT 5.0*  ALBUMIN 1.9*   No results for input(s): LIPASE, AMYLASE in the last 168 hours. No results for input(s): AMMONIA in the last 168 hours. Coagulation Profile: Recent Labs  Lab 08/08/2018 1552 08/12/18 0352  INR 1.1 1.3*   Cardiac Enzymes: No results for input(s): CKTOTAL, CKMB, CKMBINDEX, TROPONINI in the last 168 hours. BNP (last 3 results) No results for input(s): PROBNP in the last 8760 hours. HbA1C: No results for input(s): HGBA1C in the last 72 hours. CBG: Recent Labs  Lab 08/14/18 1949 08/14/18 2335 08/15/18 0358 08/15/18 0729 08/15/18 1136  GLUCAP 125* 105* 98 101* 118*   Lipid Profile: No results for input(s): CHOL, HDL, LDLCALC, TRIG, CHOLHDL, LDLDIRECT in the last 72 hours. Thyroid Function Tests: No results for input(s): TSH, T4TOTAL, FREET4, T3FREE, THYROIDAB in the last 72 hours. Anemia Panel: No results for input(s): VITAMINB12, FOLATE, FERRITIN, TIBC, IRON, RETICCTPCT in the last 72 hours. Sepsis Labs: Recent Labs  Lab 07/29/2018 1552  LATICACIDVEN 1.1    Recent Results (from the past 240 hour(s))  Urine Culture      Status: Abnormal   Collection Time: 07/20/2018  4:15 PM   Specimen: Urine, Catheterized  Result Value Ref Range Status   Specimen Description URINE, CATHETERIZED  Final   Special Requests   Final    NONE Performed at [facility], [address], [place], [place] [address]    Culture 80,000 COLONIES/mL YEAST (A)  Final   Report Status 08/11/2018 FINAL  Final  SARS Coronavirus 2 (CEPHEID - Performed in [facility]), Hosp Order     Status: None   Collection Time: 08/08/2018  4:42 PM   Specimen: Nasopharyngeal Swab  Result Value Ref Range Status   SARS Coronavirus 2 NEGATIVE NEGATIVE Final    Comment: (NOTE) If result is NEGATIVE SARS-CoV-2 target nucleic acids are NOT DETECTED. The SARS-CoV-2 RNA is generally detectable in upper and lower  respiratory specimens during the acute phase of infection. The lowest  concentration of SARS-CoV-2 viral copies this assay can detect is 250  copies / mL. A negative result does not preclude SARS-CoV-2 infection  and should not be used as the sole basis for treatment or other  patient management decisions.  A negative result may occur with  improper specimen collection / handling, submission of specimen other  than nasopharyngeal swab, presence of viral mutation(s) within the  areas targeted by this assay, and inadequate number of viral copies  (<250 copies / mL). A negative result must be combined with clinical  observations, patient history, and epidemiological information. If result is POSITIVE SARS-CoV-2 target nucleic acids are DETECTED. The SARS-CoV-2 RNA is generally detectable in upper and lower  respiratory specimens dur ing the acute phase of infection.  Positive  results are indicative of active infection with SARS-CoV-2.  Clinical  correlation with patient history and other diagnostic information is  necessary to determine patient infection status.  Positive results do  not rule out bacterial infection or co-infection  with other viruses. If result is PRESUMPTIVE POSTIVE SARS-CoV-2 nucleic acids MAY BE PRESENT.   A presumptive positive result was obtained on the submitted specimen  and confirmed on repeat testing.  While 2019 novel coronavirus  (SARS-CoV-2) nucleic acids may be present  in the submitted sample  additional confirmatory testing may be necessary for epidemiological  and / or clinical management purposes  to differentiate between  SARS-CoV-2 and other Sarbecovirus currently known to infect humans.  If clinically indicated additional testing with an alternate test  methodology [phone]) is advised. The SARS-CoV-2 RNA is generally  detectable in upper and lower respiratory sp ecimens during the acute  phase of infection. The expected result is Negative. Fact Sheet for Patients:  StrictlyIdeas.no Fact Sheet for Healthcare Providers: BankingDealers.co.za This test is not yet approved or cleared by the [place] FDA and has been authorized for detection and/or diagnosis of SARS-CoV-2 by FDA under an Emergency Use Authorization (EUA).  This EUA will remain in effect (meaning this test can be used) for the duration of the COVID-19 declaration under Section 564(b)(1) of the Act, 21 U.S.C. section 360bbb-3(b)(1), unless the authorization is terminated or revoked sooner. Performed at [facility], [place] [address], [place], [place] [address]   MRSA PCR Screening     Status: None   Collection Time: 08/10/18 12:16 AM   Specimen: Nasal Mucosa; Nasopharyngeal  Result Value Ref Range Status   MRSA by PCR NEGATIVE NEGATIVE Final    Comment:        The GeneXpert MRSA Assay (FDA approved for NASAL specimens only), is one component of a comprehensive MRSA colonization surveillance program. It is not intended to diagnose MRSA infection nor to guide or monitor treatment for MRSA infections. Performed at [facility], [place] [address],  [place], [place] [address]   Culture, blood (Routine X 2) w Reflex to ID Panel     Status: None (Preliminary result)   Collection Time: 08/10/18  5:03 AM   Specimen: BLOOD RIGHT HAND  Result Value Ref Range Status   Specimen Description BLOOD RIGHT HAND  Final   Special Requests   Final    BOTTLES DRAWN AEROBIC ONLY Blood Culture results may not be optimal due to an inadequate volume of blood received in culture bottles   Culture   Final    NO GROWTH 4 DAYS Performed at [facility], [place] [address] [place], [place] [address]    Report Status PENDING  Incomplete  Culture, blood (routine x 2)     Status: None (Preliminary result)   Collection Time: 08/10/18  5:05 AM   Specimen: BLOOD  Result Value Ref Range Status   Specimen Description BLOOD PICC LINE  Final   Special Requests   Final    BOTTLES DRAWN AEROBIC ONLY Blood Culture results may not be optimal due to an inadequate volume of blood received in culture bottles   Culture   Final    NO GROWTH 4 DAYS Performed at [facility], [place] [address], [place], [place] [address]    Report Status PENDING  Incomplete  Expectorated sputum assessment w rflx to resp cult     Status: None   Collection Time: 08/14/18  9:17 AM   Specimen: Tracheal Aspirate; Sputum  Result Value Ref Range Status   Specimen Description TRACHEAL ASPIRATE  Final   Special Requests NONE  Final   Sputum evaluation   Final    THIS SPECIMEN IS ACCEPTABLE FOR SPUTUM CULTURE Performed at [facility], [place] [address] [place], [place] [address]    Report Status 08/14/2018 FINAL  Final  Culture, respiratory     Status: None (Preliminary result)   Collection Time: 08/14/18  9:17 AM  Specimen: Tracheal Aspirate  Result Value Ref Range Status   Specimen Description TRACHEAL ASPIRATE  Final   Special Requests NONE Reflexed from [id number]  Final   Gram Stain PENDING  Incomplete   Culture   Final    CULTURE REINCUBATED FOR BETTER GROWTH Performed at [facility], [place] [address] [place], [place] [address]    Report Status PENDING  Incomplete         Radiology Studies: Dg Chest 1 View  Result Date: 08/14/2018 CLINICAL DATA:  Shortness of breath EXAM: CHEST  1 VIEW COMPARISON:  07/25/2018 FINDINGS: Chronic cardiomegaly. Dual lead pacemaker appears the same. Pulmonary venous hypertension without frank edema. Left effusion with left base atelectasis and or pneumonia. IMPRESSION: Radiographic improvement. Resolution of pulmonary edema. Pulmonary venous hypertension persists. Cardiomegaly. Left effusion and left base atelectasis and or pneumonia Electronically Signed   By: Nelson Chimes M.D.   On: 08/14/2018 09:45        Scheduled Meds: . amantadine  100 mg Per Tube Daily  . carvedilol  12.5 mg Oral BID WC  . chlorhexidine gluconate (MEDLINE KIT)  15 mL Mouth Rinse BID  . Chlorhexidine Gluconate Cloth  6 each Topical Daily  . cholecalciferol  1,000 Units Per Tube Daily  . citalopram  10 mg Per Tube Daily  . clonazePAM  0.25 mg Per Tube BID  . famotidine  20 mg Per Tube QHS  . feeding supplement (PRO-STAT SUGAR FREE 64)  30 mL Per Tube TID  . folic acid  0.5 mg Per Tube Daily  . furosemide  80 mg Intravenous Q8H  . hydrALAZINE  25 mg Per Tube BID  . insulin aspart  0-9 Units Subcutaneous Q4H  . insulin aspart  2 Units Subcutaneous Q4H  . latanoprost  1 drop Both Eyes QHS  . loratadine  10 mg Per Tube Daily  . mouth rinse  15 mL Mouth Rinse 10 times per day  . modafinil  100 mg Per Tube Daily  . polyethylene glycol  17 g Per Tube Daily  . QUEtiapine  50 mg Per Tube BID  . timolol  1 drop Both Eyes BID   Continuous Infusions: . sodium chloride 10 mL/hr at 08/14/18 1700  . feeding supplement (OSMOLITE 1.2 CAL) 60 mL/hr at 08/15/18 1200     LOS: 6 days    Time spent: 35 mins.      Shelly Coss, MD [facility] Pager [phone]  If 7PM-7AM, please contact night-coverage [email or web address] Password [facility]  08/15/2018, 12:38 PM

## 2018-08-15 NOTE — Progress Notes (Signed)
Palliative Medicine referral received for goals of care discussion. I spoke with patient's husband, Loretta Bell and introduced myself and Palliative. He verbalized understanding and appreciation. He shared he was able to visit with patient on yesterday and was appreciative of the medical team for allowing this as he had not seen her in some time.   [name] meeting has been arranged for Tuesday 08/16/2018 @ 1000 per husband's request. He is requested the following be involved in the meeting which is acceptable: Harvie Heck (daughter), Araiyah Cumpton (son), and Luz Brazen (grandson). Husband states they will arrive between 30 and 10 tomorrow. Asking for some time to visit at the bedside prior to meeting to allow family a better evaluation of his wife condition to prepare for decision making.   Also asking about several cousins and distant family members coming in to visit. Detailed explanation of the visitation policy due to COVID. He asked if they were involved in the meeting could they be exempt. Again explained due to not being immediate family and not having impact on decision making they would be unallowed to visit at this time, but did offer possibility of e-link visitation with immediate family if they wanted to arrange in their home. He verbalized understanding also stating "I am just trying to see who all I can get in to visit. No one has really seen her in a while." Support provided and apologies as this is a difficult time for family and staff to have restrictions on visitation. Explained safety concerns for patient, family, and staff. Husband politely verbalized understanding.   Again confirmed scheduled meeting for tomorrow and attendees. Husband verbalized understanding and agreement.   Thank you for your referral. Detailed note and recommendations to follow [place] meeting.   Alda Lea, AGPCNP-BC Palliative Medicine Team  Phone: [phone] Pager: [phone] Amion:  N. Cousar   NO CHARGE

## 2018-08-16 DIAGNOSIS — Z7189 Other specified counseling: Secondary | ICD-10-CM

## 2018-08-16 DIAGNOSIS — Z515 Encounter for palliative care: Secondary | ICD-10-CM

## 2018-08-16 DIAGNOSIS — R0602 Shortness of breath: Secondary | ICD-10-CM

## 2018-08-16 DIAGNOSIS — Z66 Do not resuscitate: Secondary | ICD-10-CM

## 2018-08-16 LAB — BASIC METABOLIC PANEL
Anion gap: 14 (ref 5–15)
BUN: 145 mg/dL — ABNORMAL HIGH (ref 8–23)
CO2: 25 mmol/L (ref 22–32)
Calcium: 8.6 mg/dL — ABNORMAL LOW (ref 8.9–10.3)
Chloride: 99 mmol/L (ref 98–111)
Creatinine, Ser: 2.02 mg/dL — ABNORMAL HIGH (ref 0.44–1.00)
GFR calc Af Amer: 27 mL/min — ABNORMAL LOW (ref >60)
GFR calc non Af Amer: 23 mL/min — ABNORMAL LOW (ref >60)
Glucose, Bld: 112 mg/dL — ABNORMAL HIGH (ref 70–99)
Potassium: 4.8 mmol/L (ref 3.5–5.1)
Sodium: 138 mmol/L (ref 135–145)

## 2018-08-16 LAB — CBC WITH DIFFERENTIAL/PLATELET
Abs Immature Granulocytes: 0.3 10*3/uL — ABNORMAL HIGH (ref 0.00–0.07)
Basophils Absolute: 0 10*3/uL (ref 0.0–0.1)
Basophils Relative: 0 %
Eosinophils Absolute: 0.1 10*3/uL (ref 0.0–0.5)
Eosinophils Relative: 1 %
HCT: 23.8 % — ABNORMAL LOW (ref 36.0–46.0)
Hemoglobin: 7.5 g/dL — ABNORMAL LOW (ref 12.0–15.0)
Lymphocytes Relative: 3 %
Lymphs Abs: 0.3 10*3/uL — ABNORMAL LOW (ref 0.7–4.0)
MCH: 31.1 pg (ref 26.0–34.0)
MCHC: 31.5 g/dL (ref 30.0–36.0)
MCV: 98.8 fL (ref 80.0–100.0)
Monocytes Absolute: 0.3 10*3/uL (ref 0.1–1.0)
Monocytes Relative: 3 %
Myelocytes: 3 %
Neutro Abs: 8 10*3/uL — ABNORMAL HIGH (ref 1.7–7.7)
Neutrophils Relative %: 90 %
Platelets: 149 10*3/uL — ABNORMAL LOW (ref 150–400)
RBC: 2.41 MIL/uL — ABNORMAL LOW (ref 3.87–5.11)
RDW: 18 % — ABNORMAL HIGH (ref 11.5–15.5)
WBC: 8.9 10*3/uL (ref 4.0–10.5)
nRBC: 0.9 % — ABNORMAL HIGH (ref 0.0–0.2)
nRBC: 1 /100 WBC — ABNORMAL HIGH

## 2018-08-16 LAB — GLUCOSE, CAPILLARY
Glucose-Capillary: 156 mg/dL — ABNORMAL HIGH (ref 70–99)
Glucose-Capillary: 131 mg/dL — ABNORMAL HIGH (ref 70–99)
Glucose-Capillary: 233 mg/dL — ABNORMAL HIGH (ref 70–99)
Glucose-Capillary: 246 mg/dL — ABNORMAL HIGH (ref 70–99)
Glucose-Capillary: 189 mg/dL — ABNORMAL HIGH (ref 70–99)

## 2018-08-16 MED ORDER — LEVOFLOXACIN 25 MG/ML PO SOLN
500.0000 mg | ORAL | Status: DC
  Administered 2018-08-16: 500 mg
  Filled 2018-08-16: qty 20

## 2018-08-16 NOTE — Progress Notes (Signed)
PALLIATIVE NOTE:  Patient's husband, Voncille Simm and son, Zoiey Christy called to express appreciation to the medical team for updates and meeting on today. Family appreciative of being able to visit and see their loved one.   Son states family has decided to continue care for the next 24-48hrs to see if she has any changes. They remain hopeful but is preparing for the worst. Son states if patient requires ventilation during the afternoon or night hours they are ok with this however, if she requires ventilation during the day or has difficulty weaning in the morning hours they would like to be called. He states at that time they will be prepared to transition care to a more comfort focused with wishes to discontinue ventilation and allow for EOL care. He again states family would like to continue with current plan of care over the next couple of days and make more decisions later in the week or weekend if she remains in current state with no signs of improvement, with an exception to make a decision sooner in the event she shows continued signs of respiratory distress throughout the day and night. Support provided.   Son requesting follow up call tomorrow and father approves to contact son with updates.   Alda Lea, AGPCNP-BC Palliative Medicine Team  Phone: [phone] Pager: [phone] Amion: N. Cousar   NO CHARGE

## 2018-08-16 NOTE — Consult Note (Addendum)
Consultation Note Date: 08/16/2018   Patient Name: Loretta Bell  DOB: June 01, 1940  MRN: [id number]  Age / Sex: 78 y.o., female   PCP: Gilford Silvius, MD Referring Physician: Shelly Coss, MD   REASON FOR CONSULTATION:Establishing goals of care  Palliative Care consult requested for this 78 y.o. female with multiple medical problems including chronic respiratory failure (trach collar at [facility]), tracheostomy, PEG, atrial fibrillation, aortic valve endocarditis, CKD Stage IV, DM, HTN, CHF (EF 60-65% 02/2018), pulmonary hemorrhage, bedbound since 02/2018, and UTIs. She presented to ED from [facility] due to acute low oxygen saturations x1 day and placed back on the ventilator. Also noted increased pitting edema and worsening renal function. Since admission patient continues to have episodes of respiratory distress requiring ventilation. She is on Levaquin due to tracheal aspirate showing enterococcus faecium. PMT consult for goals of care discussions.   Clinical Assessment and Goals of Care: I have reviewed medical records including lab results, imaging, Epic notes, and MAR, received report from the bedside RN, and assessed the patient. I met at the bedside with patient's husband, Loretta Bell (son), Loretta Bell (daughter) and Loretta Bell (grandson) to discuss diagnosis prognosis, GOC, EOL wishes, disposition and options. Patient remains unresponsive. On Trach collar.   I introduced Palliative Medicine as specialized medical care for people living with serious illness. It focuses on providing relief from the symptoms and stress of a serious illness. The goal is to improve quality of life for both the patient and the family.  We discussed a brief life review of the patient, along with her functional and nutritional status. Husband shares patient shares they have been together for 8 years (married 21 years). They have 2 children and 3 grandchildren. She loves spending time with family and  friends.   Family reports patient has been hospitalized at [facility] for the past 4-6 weeks after requiring a tracheostomy/PEG due to respiratory failure. She was able to wean from the vent to trach collar 3 weeks ago. Prior to February 12, 2018 family reports patient was walking, independently performing ADLs, cooking, and working in the garden. Husband states her health has declined since 02/12/2018 the day she walked in the [facility] and never walked or been the same since. He shares that she required intubation several times during her stay and developed Petechia due to antibiotics while hospitalized. She was able to discharge to rehab in Feb for several weeks only to return to the [facility] due to pneumonia. She discharged again to rehab in March and within 2 weeks back in the hospital due to fluid overload and worsening renal failure requiring CRRT, intubation, trach, PEG, and aggressive care.   We discussed Her current illness and what it means in the larger context of Her on-going co-morbidities. With specific discussions regarding worsening renal function, CHF, respiratory distress requiring intermittent ventilation, and her overall functional state. Natural disease trajectory and expectations at EOL were discussed. Family verbalizes understanding of her condition.   They are concerned with updates of renal function, and cardiac function. I reviewed in detail labs, echo results (EF 35-40% 08/10/18 compared to 60-65% 02/13/2018), and trajectory of her chronic illness. Family questioning the need for dialysis to eliminate fluid. We discussed in details why HD is not an option at this point with focus on her functioning kidneys, and continued cycle of fluid overload and respiratory distress. Family initially requesting [facility] to receive care by her primary medical providers. Discussed in details that patient was receiving maximum  care, is not stable for transfer, and also discussed her  cardiologist and nephrologist are outpatient providers.   Medical updates and support also provided by Dr. Valeta Harms and Dr. Tawanna Solo at the bedside. Family appreciative for detailed medical adults and support of the medical team.   I attempted to elicit values and goals of care important to the patient.    The difference between aggressive medical intervention and comfort care was considered in light of the patient's goals of care. Family states they are remaining hopeful but also preparing for the worst. Husband is tearful and expresses her decline over the months and daughter agrees. He states he does not want her to suffer, but wants to give her time to show improvements if she will. Family asked appropriate questions regarding options. We discussed best case and worst case scenario with focus on her continued decline over the past 6 months with continued cycle of exacerbation of CHF, fluid overload, and worsening renal and the impact on her health and quality of life each time she is hospitalized.   I encouraged family to consider patient's current condition and her quality of life compared to how she was a year ago and 6 months ago. Encouraged them to consider if it was appropriate to continue to do things to her or should focus be on doing things more so for her and her comfort knowing the severe decline in her health and quality as shared by family mutually. Family verbalized understanding and expressed they would like to continue with conversations amongst themselves and make for further decisions. Son asked appropriate questions regarding what comfort care would look like.  I educated family on what comfort care measures would look like and all questions answered. Family verbalized understanding and appreciation.   Family mutually confirmed wishes for DNR. After conversations with medical team and updates they are considering limitations to when to eliminate the use of the ventilator. Support given.     Family was allowed to spend some time at the bedside with patient. Family sharing concerns of her unresponsiveness and difference in her condition due to decline compared to months ago. Support provided.  Questions and concerns were addressed. The family was encouraged to call with questions or concerns.  PMT will continue to support holistically.   SOCIAL HISTORY:     reports that she has never smoked. She has never used smokeless tobacco. She reports that she does not drink alcohol or use drugs.  CODE STATUS: DNR  ADVANCE DIRECTIVES: NEXT OF KIN    SYMPTOM MANAGEMENT: per attending  Palliative Prophylaxis:   Aspiration, Bowel Regimen, Delirium Protocol, Frequent Pain Assessment, Oral Care, Palliative Wound Care and Turn Reposition  PSYCHO-SOCIAL/SPIRITUAL:  Support System: Family   Desire for further Chaplaincy support:NO   Additional Recommendations (Limitations, Scope, Preferences):  Full Scope Treatment   PAST MEDICAL HISTORY: Past Medical History:  Diagnosis Date   Acute on chronic diastolic heart failure (HCC)    Acute on chronic respiratory failure with hypoxia (HCC)    Atrial fibrillation (HCC)    Chronic atrial fibrillation    Chronic kidney disease (CKD)    Chronic kidney disease, stage IV (severe) (HCC)    Diabetes mellitus ([place])    Hypertension    Pulmonary alveolar hemorrhage     PAST SURGICAL HISTORY:  Past Surgical History:  Procedure Laterality Date   ATRIAL ABLATION SURGERY     EYE SURGERY     THYROID SURGERY      ALLERGIES:  is  allergic to diphenhydramine hcl; piperacillin sod-tazobactam so; sulfamethoxazole-trimethoprim; cefazolin; saxagliptin; and sulfamethoxazole.   MEDICATIONS:  Current Facility-Administered Medications  Medication Dose Route Frequency Provider Last Rate Last Dose   0.9 %  sodium chloride infusion   Intravenous PRN Icard, Bradley L, DO 10 mL/hr at 08/14/18 1700     amantadine (SYMMETREL) capsule 100 mg   100 mg Per Tube Daily Meccariello, Bernita Raisin, DO   100 mg at 08/14/18 0944   carvedilol (COREG) tablet 12.5 mg  12.5 mg Oral BID WC Frederik Pear, MD   12.5 mg at 08/16/18 0914   chlorhexidine gluconate (MEDLINE KIT) (PERIDEX) 0.12 % solution 15 mL  15 mL Mouth Rinse BID Shelly Coss, MD   15 mL at 08/16/18 0800   Chlorhexidine Gluconate Cloth 2 % PADS 6 each  6 each Topical Daily Mannam, Hart Robinsons, MD   6 each at 08/15/18 0946   cholecalciferol (VITAMIN D3) tablet 1,000 Units  1,000 Units Per Tube Daily Meccariello, Bernita Raisin, DO   1,000 Units at 08/16/18 0912   citalopram (CELEXA) tablet 10 mg  10 mg Per Tube Daily Meccariello, Bernita Raisin, DO   10 mg at 08/16/18 0913   clonazepam (KLONOPIN) disintegrating tablet 0.25 mg  0.25 mg Per Tube BID Meccariello, Bernita Raisin, DO   0.25 mg at 08/16/18 0919   famotidine (PEPCID) 40 MG/5ML suspension 20 mg  20 mg Per Tube QHS Mannam, Praveen, MD   20 mg at 08/15/18 2230   feeding supplement (OSMOLITE 1.2 CAL) liquid 1,000 mL  1,000 mL Per Tube Continuous Mannam, Praveen, MD 60 mL/hr at 08/15/18 1800     feeding supplement (PRO-STAT SUGAR FREE 64) liquid 30 mL  30 mL Per Tube TID Mannam, Praveen, MD   30 mL at 08/16/18 0912   fentaNYL (SUBLIMAZE) injection 25-50 mcg  25-50 mcg Intravenous Q30 min PRN Jennelle Human B, NP   50 mcg at 11/57/26 2035   folic acid (FOLVITE) tablet 0.5 mg  0.5 mg Per Tube Daily Meccariello, Bernita Raisin, DO   0.5 mg at 08/16/18 0912   hydrALAZINE (APRESOLINE) tablet 25 mg  25 mg Per Tube BID Shelly Coss, MD   25 mg at 08/15/18 0807   insulin aspart (novoLOG) injection 0-9 Units  0-9 Units Subcutaneous Q4H Jennelle Human B, NP   1 Units at 08/16/18 0900   insulin aspart (novoLOG) injection 2 Units  2 Units Subcutaneous Q4H Adhikari, Amrit, MD   2 Units at 08/16/18 0900   latanoprost (XALATAN) 0.005 % ophthalmic solution 1 drop  1 drop Both Eyes QHS Meccariello, Bailey J, DO   1 drop at 08/15/18 2231   loratadine  (CLARITIN) tablet 10 mg  10 mg Per Tube Daily Meccariello, Bailey J, DO   10 mg at 08/16/18 0915   MEDLINE mouth rinse  15 mL Mouth Rinse 10 times per day Jennelle Human B, NP   15 mL at 08/16/18 0500   modafinil (PROVIGIL) tablet 100 mg  100 mg Per Tube Daily Meccariello, Bailey J, DO   100 mg at 08/16/18 0914   morphine 4 MG/ML injection 4 mg  4 mg Intravenous Q4H PRN Shelly Coss, MD   4 mg at 08/15/18 1613   oxyCODONE (Oxy IR/ROXICODONE) immediate release tablet 5 mg  5 mg Per Tube Q6H PRN Meccariello, Bernita Raisin, DO   5 mg at 08/14/18 2301   polyethylene glycol (MIRALAX / GLYCOLAX) packet 17 g  17 g Per Tube Daily Meccariello, Bernita Raisin, DO  17 g at 08/15/18 0944   QUEtiapine (SEROQUEL) tablet 50 mg  50 mg Per Tube BID Meccariello, Bernita Raisin, DO   50 mg at 08/16/18 0915   timolol (TIMOPTIC) 0.5 % ophthalmic solution 1 drop  1 drop Both Eyes BID Meccariello, Bernita Raisin, DO   1 drop at 08/15/18 2200    VITAL SIGNS: BP 93/66    Pulse (!) 110    Temp 98.2 F (36.8 C) (Oral)    Resp (!) 36    Ht '5\' 2"'$  (1.575 m)    Wt 105 kg    LMP  (LMP Unknown)    SpO2 (!) 88%    BMI 42.34 kg/m  Filed Weights   08/13/18 0500 08/15/18 0600 08/16/18 0139  Weight: 106.8 kg 105.6 kg 105 kg    Estimated body mass index is 42.34 kg/m as calculated from the following:   Height as of this encounter: '5\' 2"'$  (1.575 m).   Weight as of this encounter: 105 kg.  LABS: CBC:    Component Value Date/Time   WBC 8.9 08/16/2018 0723   HGB 7.5 (L) 08/16/2018 0723   HCT 23.8 (L) 08/16/2018 0723   PLT 149 (L) 08/16/2018 0723   Comprehensive Metabolic Panel:    Component Value Date/Time   NA 138 08/16/2018 0610   K 4.8 08/16/2018 0610   CO2 25 08/16/2018 0610   BUN 145 (H) 08/16/2018 0610   CREATININE 2.02 (H) 08/16/2018 0610   ALBUMIN 1.9 (L) 08/08/2018 1552     Review of Systems  Unable to perform ROS: Patient unresponsive  Trach Collar   Physical Exam General:morbidily obese, critically and  chronically-ill appearing, trach collar Cardiovascular: irregular (a-fib), bilateral lower extremity pitting edema Pulmonary: diminished, trach collar with intermittent ventilation requirments Abdomen: soft, + bowel sounds GU: foley in place, clear yellow urine  Extremities:anasarca, weeping requiring bandage and wraps Skin: bilateral upper extremity oozing, ecchymosis, petechiae  Neurological: Unresponsive, will not follow commands   Prognosis: Guarded to Poor in the setting of acute on chronic hypoxic respiratory failure, pneumonia, enerococcus faecium, acute on chronic CHF (EF 35-40%), pulmonary hemorrhage, thrombocytopenia, CKD stage IV, hyperlipidemia, bed bound, sacral wound.   Discharge Planning:  To Be Determined  Recommendations:  DNR- as confirmed by family  Continue current treatment plan per attending  Family remains hopeful but also preparing for the worst. They remain in conversation amongst themselves with decisions to discontinue ventilator use and possible transition to comfort care.   PMT will continue to closely support and follow   Palliative Performance Scale: UNRESPONSIVE/TRACH (COLLAR AND VENT SUPPORT AT TIMES)              Family expressed understanding and was in agreement with this plan.   Thank you for allowing the Palliative Medicine Team to assist in the care of this patient.  Time In: 1100 Time Out: 1315 Time Total: 135 min.   Visit consisted of counseling and education dealing with the complex and emotionally intense issues of symptom management and palliative care in the setting of serious and potentially life-threatening illness.Greater than 50%  of this time was spent counseling and coordinating care related to the above assessment and plan.  Signed by:  Alda Lea, AGPCNP-BC Palliative Medicine Team  Phone: [phone] Fax: [phone] Pager: [phone] Amion: Bjorn Pippin

## 2018-08-16 NOTE — Progress Notes (Signed)
PCCM:  I was called by Osborne Oman and Dr. Tawanna Solo to assist in a palliative care meeting.   We discussed the patients overall prognosis and continued physical and medical decline for the past 6 months.   At this point I believe they have a better understanding of her overall likely outcome. She is currently on TCT and tolerating.   My medical recommendation is that she is to not be placed back on mechanical life support. The family are going to discuss among themselves and come up with there final plan. It appears as if they would like to have more time with her and potentially wait until Saturday when other family members can visit before transitioning to comfort care.   PCCM will be available for any additional prolonged mechanical ventilatory support questions.   47 minutes spent with family at bedside for advanced care planning.   Garner Nash, DO [facility] 08/16/2018 1:24 PM

## 2018-08-16 NOTE — Progress Notes (Signed)
PROGRESS NOTE    Loretta Bell  XLK:[id number] DOB: 08/28/1940 DOA: 07/11/2018 PCP: Gilford Silvius, MD   Brief Narrative: Patient is a 78 year old female with history of chronic hypoxic respiratory failure on trach collar at [facility], A. fib, history of aortic valve endocarditis, CKD stage IV, diabetes mellitus, hypertension, congestive heart failure, history of pulmonary hemorrhage, UTIs who was sent from [facility] due to desaturation.  She was sent to the emergency department on vent.  Nursing staff reported that she had increased bilateral lower extremity edema including anasarca.  She was not on diuretics on [facility].  Patient was admitted under PCCM service.  She was diuresed with IV Lasix.  Patient handed over to [facility] on 08/11/2018.Currently on Trach Collar.   7/5/20Martin Majestic into respiratory distress again this morning.  Had to be put on vent.  Husband came to visit her today.  Talked to the husband in person.  She is continued on IV diuresis.  Due to increased work of breathing, she had to be put back on ventilator.  Patient is DNR now.  Palliative care has been consulted.  Tracheal aspirate revealed thick purulent mucus.  Chest x-ray did not show pneumonia and showed improved pulmonary edema.  Sputum culture sent.  08/15/18: As per the discussion with husband, patient was made DNR yesterday.  Palliative care having a goal of care meeting on 08/16/2018.  Likely plan for removing the trach and initiating comfort care. She is back to trach collar today.  Currently on 12 L of oxygen per minute.  Looks volume overloaded.  Being given IV Lasix  08/16/18: Family meeting held today at the bedside.  PCCM, Palliative care  was present.  As per now, patient remains DNR.  Family thinks they need more discussion about comfort care initiation.PCCM does not recommend to be placed back on mechanical life support. Patient seen and examined the bedside this morning.  Currently on trach collar but is on 2 L of oxygen  per minute.  Continues to remain in moderate respiratory distress.  Tracheal aspirate showed few Enterococcus faecium .Started on Levaquin.  Lasix has been held due to hypotension.  Assessment & Plan:   Active Problems:   Acute respiratory failure (HCC)   Pressure injury of skin   Acute on chronic hypoxic respiratory failure: Most likely secondary to volume overload secondary to congestive heart failure. She is on trach collar on baseline.    Suspected pneumonia: Initially Started on vancomycin and meropenem.  She has history of enterococcus bacteremia and also drug-resistant UTI.   She was started on cefepime at [facility] on 08/06/2018.  Discontinued the antibiotics .Tracheal aspirate showed few Enterococcus faecium .Started on levaquin.We will follow final culture report.  Acute on chronic congestive heart failure : Echocardiogram done here showed reduced ejection fraction of 35 to 40%. Likely chronic finding. Patient not on Lasix at baseline.    Continue carvedilol.  Discontinued Cardizem.  Not started on ACE/ARB due to AKI. IV discontinued due to hypotension.  History of recent pulmonary hemorrhage: Reported at outside hospital, prior hospitalization on 6/20.  On Eliquis which has been held due to thrombocytopenia, ecchymosis  Hyponatremia/hypokalemia: Improved  Normocytic anemia: Stable.  Continue to monitor CBC.  Thrombocytopenia: Has diffusely scattered petechiae and ecchymosis.  Eliquis has been held.  Smear without schistocytes.  AKI on CKD stage III: Lasix on hold.Creatinine in the range of 2.  Hyperlipidemia: Was On Lipitor, gemfibrozil  Glaucoma: Continue eyedrops  Chronic A. fib: Currently rate is controlled.  Continue  carvedilol.  Eliquis held due to anemia, thrombocytopenia and petechiae/ecchymosis.S/P pacemaker.  Diabetes mellitus: Continue insulin as ordered.   Hypertension: On Coreg, hydralazine and Cardizem.BP stable  Debility/deconditioning/multiple  comorbidities: Palliative care following.  Family discussing him among themselves  For  final decision on comfort care/removal of oxygen supplementation.  Nutrition Problem: Inadequate oral intake Etiology: acute illness      DVT prophylaxis: SCD Code Status: partial Family Communication: Goal of care discussion meeting today with family. Disposition Plan: Undetermined for now  Consultants: PCCM  Procedures: None Antimicrobials:  Anti-infectives (From admission, onward)   Start     Dose/Rate Route Frequency Ordered Stop   08/16/18 1200  levofloxacin (LEVAQUIN) 25 MG/ML solution 500 mg     500 mg Per Tube Every 48 hours 08/16/18 1134     08/10/18 2200  vancomycin (VANCOCIN) 1,250 mg in sodium chloride 0.9 % 250 mL IVPB  Status:  Discontinued     1,250 mg 166.7 mL/hr over 90 Minutes Intravenous Every 24 hours 07/27/2018 2228 08/12/18 1344   07/28/2018 2230  vancomycin (VANCOCIN) 1,500 mg in sodium chloride 0.9 % 500 mL IVPB     1,500 mg 250 mL/hr over 120 Minutes Intravenous  Once 08/06/2018 2228 08/10/18 0235   07/20/2018 2230  meropenem (MERREM) 1 g in sodium chloride 0.9 % 100 mL IVPB  Status:  Discontinued     1 g 200 mL/hr over 30 Minutes Intravenous Every 12 hours 08/01/2018 2228 08/12/18 1344       Objective: Vitals:   08/16/18 0900 08/16/18 1000 08/16/18 1100 08/16/18 1141  BP: '93/66 98/77 95/75 '$   Pulse: (!) 110 (!) 125 97 95  Resp: (!) 36 (!) 21 (!) 21 (!) 22  Temp:      TempSrc:      SpO2: (!) 88% 96% 99% 97%  Weight:      Height:        Intake/Output Summary (Last 24 hours) at 08/16/2018 1334 Last data filed at 08/16/2018 1100 Gross per 24 hour  Intake 960 ml  Output 459 ml  Net 501 ml   Filed Weights   08/13/18 0500 08/15/18 0600 08/16/18 0139  Weight: 106.8 kg 105.6 kg 105 kg    Examination:  General exam: Critically ill, morbidly obese, in moderate respiratory distress  HEENT:trach collar Respiratory system: Bilateral decreased air entry Cardiovascular  system: A. fib  gastrointestinal system: Abdomen is distended, soft and nontender Central nervous system: Not Alert and oriented Extremities: Anasarca, no clubbing ,no cyanosis Skin:  Diffuse ecchymosis, petechiae, oozing from the right upper extremity   Data Reviewed: I have personally reviewed following labs and imaging studies  CBC: Recent Labs  Lab 07/27/2018 1552  08/10/18 0505 08/11/18 0446 08/14/18 0305 08/15/18 0445 08/16/18 0723  WBC 11.3*  --  11.6* 13.7* 9.0 7.2 8.9  NEUTROABS 9.6*  --   --   --  7.3 5.5 8.0*  HGB 8.2*   < > 8.2* 8.3* 7.6* 7.2* 7.5*  HCT 26.6*   < > 25.9* 26.6* 24.7* 23.3* 23.8*  MCV 96.4  --  94.9 96.4 99.2 98.7 98.8  PLT 95*  --  81* 88* 116* 128* 149*   < > = values in this interval not displayed.   Basic Metabolic Panel: Recent Labs  Lab 08/10/18 0505 08/10/18 1600 08/11/18 0446 08/11/18 2244 08/12/18 0352 08/13/18 0523 08/14/18 0305 08/14/18 1438 08/16/18 0610  NA 134*  --  130*  --  133* 135 136 137 138  K 3.4*  --  3.6  --  4.2 4.3 4.4 4.2 4.8  CL 95*  --  96*  --  95* 97* 99 97* 99  CO2 27  --  27  --  '28 27 29 29 25  '$ GLUCOSE 80  --  281*  --  162* 188* 136* 173* 112*  BUN 86*  --  88*  --  95* 107* 115* 119* 145*  CREATININE 1.51*  --  1.49*  --  1.50* 1.59* 1.52* 1.66* 2.02*  CALCIUM 8.5*  --  8.2*  --  8.4* 8.5* 8.6* 8.7* 8.6*  MG 2.4 2.4 2.1 2.2  --   --   --   --   --   PHOS 3.4 3.7 3.9  --   --   --   --   --   --    GFR: Estimated Creatinine Clearance: 27 mL/min (A) (by C-G formula based on SCr of 2.02 mg/dL (H)). Liver Function Tests: Recent Labs  Lab 07/14/2018 1552  AST 30  ALT 24  ALKPHOS 154*  BILITOT 1.1  PROT 5.0*  ALBUMIN 1.9*   No results for input(s): LIPASE, AMYLASE in the last 168 hours. No results for input(s): AMMONIA in the last 168 hours. Coagulation Profile: Recent Labs  Lab 08/04/2018 1552 08/12/18 0352  INR 1.1 1.3*   Cardiac Enzymes: No results for input(s): CKTOTAL, CKMB, CKMBINDEX,  TROPONINI in the last 168 hours. BNP (last 3 results) No results for input(s): PROBNP in the last 8760 hours. HbA1C: No results for input(s): HGBA1C in the last 72 hours. CBG: Recent Labs  Lab 08/15/18 1537 08/15/18 1951 08/15/18 2338 08/16/18 0305 08/16/18 0802  GLUCAP 137* 172* 156* 156* 131*   Lipid Profile: No results for input(s): CHOL, HDL, LDLCALC, TRIG, CHOLHDL, LDLDIRECT in the last 72 hours. Thyroid Function Tests: No results for input(s): TSH, T4TOTAL, FREET4, T3FREE, THYROIDAB in the last 72 hours. Anemia Panel: No results for input(s): VITAMINB12, FOLATE, FERRITIN, TIBC, IRON, RETICCTPCT in the last 72 hours. Sepsis Labs: Recent Labs  Lab 07/14/2018 1552  LATICACIDVEN 1.1    Recent Results (from the past 240 hour(s))  Urine Culture     Status: Abnormal   Collection Time: 07/23/2018  4:15 PM   Specimen: Urine, Catheterized  Result Value Ref Range Status   Specimen Description URINE, CATHETERIZED  Final   Special Requests   Final    NONE Performed at [facility], [address], [place], [place] [address]    Culture 80,000 COLONIES/mL YEAST (A)  Final   Report Status 08/11/2018 FINAL  Final  SARS Coronavirus 2 (CEPHEID - Performed in [facility] lab), Hosp Order     Status: None   Collection Time: 07/14/2018  4:42 PM   Specimen: Nasopharyngeal Swab  Result Value Ref Range Status   SARS Coronavirus 2 NEGATIVE NEGATIVE Final    Comment: (NOTE) If result is NEGATIVE SARS-CoV-2 target nucleic acids are NOT DETECTED. The SARS-CoV-2 RNA is generally detectable in upper and lower  respiratory specimens during the acute phase of infection. The lowest  concentration of SARS-CoV-2 viral copies this assay can detect is 250  copies / mL. A negative result does not preclude SARS-CoV-2 infection  and should not be used as the sole basis for treatment or other  patient management decisions.  A negative result may occur with  improper specimen collection /  handling, submission of specimen other  than nasopharyngeal swab, presence of viral mutation(s) within the  areas targeted  by this assay, and inadequate number of viral copies  (<250 copies / mL). A negative result must be combined with clinical  observations, patient history, and epidemiological information. If result is POSITIVE SARS-CoV-2 target nucleic acids are DETECTED. The SARS-CoV-2 RNA is generally detectable in upper and lower  respiratory specimens dur ing the acute phase of infection.  Positive  results are indicative of active infection with SARS-CoV-2.  Clinical  correlation with patient history and other diagnostic information is  necessary to determine patient infection status.  Positive results do  not rule out bacterial infection or co-infection with other viruses. If result is PRESUMPTIVE POSTIVE SARS-CoV-2 nucleic acids MAY BE PRESENT.   A presumptive positive result was obtained on the submitted specimen  and confirmed on repeat testing.  While 2019 novel coronavirus  (SARS-CoV-2) nucleic acids may be present in the submitted sample  additional confirmatory testing may be necessary for epidemiological  and / or clinical management purposes  to differentiate between  SARS-CoV-2 and other Sarbecovirus currently known to infect humans.  If clinically indicated additional testing with an alternate test  methodology [phone]) is advised. The SARS-CoV-2 RNA is generally  detectable in upper and lower respiratory sp ecimens during the acute  phase of infection. The expected result is Negative. Fact Sheet for Patients:  StrictlyIdeas.no Fact Sheet for Healthcare Providers: BankingDealers.co.za This test is not yet approved or cleared by the [place] FDA and has been authorized for detection and/or diagnosis of SARS-CoV-2 by FDA under an Emergency Use Authorization (EUA).  This EUA will remain in effect (meaning this  test can be used) for the duration of the COVID-19 declaration under Section 564(b)(1) of the Act, 21 U.S.C. section 360bbb-3(b)(1), unless the authorization is terminated or revoked sooner. Performed at [facility], [place] [address] [place], [place] [address]   MRSA PCR Screening     Status: None   Collection Time: 08/10/18 12:16 AM   Specimen: Nasal Mucosa; Nasopharyngeal  Result Value Ref Range Status   MRSA by PCR NEGATIVE NEGATIVE Final    Comment:        The GeneXpert MRSA Assay (FDA approved for NASAL specimens only), is one component of a comprehensive MRSA colonization surveillance program. It is not intended to diagnose MRSA infection nor to guide or monitor treatment for MRSA infections. Performed at [facility], [place] [address] [place], [place] [address]   Culture, blood (Routine X 2) w Reflex to ID Panel     Status: None   Collection Time: 08/10/18  5:03 AM   Specimen: BLOOD RIGHT HAND  Result Value Ref Range Status   Specimen Description BLOOD RIGHT HAND  Final   Special Requests   Final    BOTTLES DRAWN AEROBIC ONLY Blood Culture results may not be optimal due to an inadequate volume of blood received in culture bottles   Culture   Final    NO GROWTH 5 DAYS Performed at [facility], [place] [address] [place], [place] [address]    Report Status 08/15/2018 FINAL  Final  Culture, blood (routine x 2)     Status: None   Collection Time: 08/10/18  5:05 AM   Specimen: BLOOD  Result Value Ref Range Status   Specimen Description BLOOD PICC LINE  Final   Special Requests   Final    BOTTLES DRAWN AEROBIC ONLY Blood Culture results may not be optimal due to an inadequate volume of blood received in culture bottles  Culture   Final    NO GROWTH 5 DAYS Performed at [facility], [place] [address] [place], [place] [address]    Report Status 08/15/2018 FINAL  Final  Expectorated sputum assessment w rflx to resp cult     Status: None   Collection  Time: 08/14/18  9:17 AM   Specimen: Tracheal Aspirate; Sputum  Result Value Ref Range Status   Specimen Description TRACHEAL ASPIRATE  Final   Special Requests NONE  Final   Sputum evaluation   Final    THIS SPECIMEN IS ACCEPTABLE FOR SPUTUM CULTURE Performed at [facility], [place] [address], [place], [place] [address]    Report Status 08/14/2018 FINAL  Final  Culture, respiratory     Status: None (Preliminary result)   Collection Time: 08/14/18  9:17 AM   Specimen: Tracheal Aspirate  Result Value Ref Range Status   Specimen Description TRACHEAL ASPIRATE  Final   Special Requests NONE Reflexed from [id number]  Final   Gram Stain NO WBC SEEN FEW GRAM POSITIVE COCCI IN PAIRS   Final   Culture   Final    FEW ENTEROCOCCUS FAECIUM SUSCEPTIBILITIES TO FOLLOW CULTURE REINCUBATED FOR BETTER GROWTH Performed at [facility], [place] [address] [place], [place] [address]    Report Status PENDING  Incomplete         Radiology Studies: No results found.      Scheduled Meds: . amantadine  100 mg Per Tube Daily  . carvedilol  12.5 mg Oral BID WC  . chlorhexidine gluconate (MEDLINE KIT)  15 mL Mouth Rinse BID  . Chlorhexidine Gluconate Cloth  6 each Topical Daily  . cholecalciferol  1,000 Units Per Tube Daily  . citalopram  10 mg Per Tube Daily  . clonazePAM  0.25 mg Per Tube BID  . famotidine  20 mg Per Tube QHS  . feeding supplement (PRO-STAT SUGAR FREE 64)  30 mL Per Tube TID  . folic acid  0.5 mg Per Tube Daily  . hydrALAZINE  25 mg Per Tube BID  . insulin aspart  0-9 Units Subcutaneous Q4H  . insulin aspart  2 Units Subcutaneous Q4H  . latanoprost  1 drop Both Eyes QHS  . levofloxacin  500 mg Per Tube Q48H  . loratadine  10 mg Per Tube Daily  . mouth rinse  15 mL Mouth Rinse 10 times per day  . modafinil  100 mg Per Tube Daily  . polyethylene glycol  17 g Per Tube Daily  . QUEtiapine  50 mg Per Tube BID  . timolol  1 drop Both Eyes BID   Continuous Infusions:  . sodium chloride 10 mL/hr at 08/14/18 1700  . feeding supplement (OSMOLITE 1.2 CAL) 60 mL/hr at 08/15/18 1800     LOS: 7 days    Time spent: 55 mins.      Shelly Coss, MD [facility] Pager [phone]  If 7PM-7AM, please contact night-coverage [email or web address] Password [email or web address] 08/16/2018, 1:34 PM

## 2018-08-17 LAB — URINALYSIS, MICROSCOPIC (REFLEX): RBC / HPF: >50 RBC/hpf (ref 0–5)

## 2018-08-17 LAB — GLUCOSE, CAPILLARY
Glucose-Capillary: 153 mg/dL — ABNORMAL HIGH (ref 70–99)
Glucose-Capillary: 162 mg/dL — ABNORMAL HIGH (ref 70–99)
Glucose-Capillary: 236 mg/dL — ABNORMAL HIGH (ref 70–99)
Glucose-Capillary: 250 mg/dL — ABNORMAL HIGH (ref 70–99)

## 2018-08-17 LAB — CBC WITH DIFFERENTIAL/PLATELET
Abs Immature Granulocytes: 0.27 10*3/uL — ABNORMAL HIGH (ref 0.00–0.07)
Basophils Absolute: 0 10*3/uL (ref 0.0–0.1)
Basophils Relative: 0 %
Eosinophils Absolute: 0.1 10*3/uL (ref 0.0–0.5)
Eosinophils Relative: 1 %
HCT: 24.7 % — ABNORMAL LOW (ref 36.0–46.0)
Hemoglobin: 7.6 g/dL — ABNORMAL LOW (ref 12.0–15.0)
Immature Granulocytes: 3 %
Lymphocytes Relative: 9 %
Lymphs Abs: 0.9 10*3/uL (ref 0.7–4.0)
MCH: 30.3 pg (ref 26.0–34.0)
MCHC: 30.8 g/dL (ref 30.0–36.0)
MCV: 98.4 fL (ref 80.0–100.0)
Monocytes Absolute: 0.3 10*3/uL (ref 0.1–1.0)
Monocytes Relative: 2 %
Neutro Abs: 8.8 10*3/uL — ABNORMAL HIGH (ref 1.7–7.7)
Neutrophils Relative %: 85 %
Platelets: 174 10*3/uL (ref 150–400)
RBC: 2.51 MIL/uL — ABNORMAL LOW (ref 3.87–5.11)
RDW: 17.9 % — ABNORMAL HIGH (ref 11.5–15.5)
WBC Morphology: INCREASED
WBC: 10.4 10*3/uL (ref 4.0–10.5)
nRBC: 0.9 % — ABNORMAL HIGH (ref 0.0–0.2)

## 2018-08-17 LAB — URINALYSIS, ROUTINE W REFLEX MICROSCOPIC

## 2018-08-17 LAB — PROTIME-INR
INR: 1.3 — ABNORMAL HIGH (ref 0.8–1.2)
Prothrombin Time: 16.4 seconds — ABNORMAL HIGH (ref 11.4–15.2)

## 2018-08-17 LAB — APTT: aPTT: 33 seconds (ref 24–36)

## 2018-08-17 MED ORDER — LORAZEPAM 2 MG/ML IJ SOLN
1.0000 mg | INTRAMUSCULAR | Status: DC | PRN
  Administered 2018-08-17: 1 mg via INTRAVENOUS
  Filled 2018-08-17: qty 1

## 2018-08-17 MED ORDER — GLYCOPYRROLATE 0.2 MG/ML IJ SOLN
0.3000 mg | INTRAMUSCULAR | Status: DC | PRN
  Administered 2018-08-17: 16:00:00 0.3 mg via INTRAVENOUS
  Filled 2018-08-17: qty 2

## 2018-08-17 MED ORDER — SODIUM CHLORIDE 0.9 % IV BOLUS
250.0000 mL | Freq: Once | INTRAVENOUS | Status: AC
  Administered 2018-08-17: 250 mL via INTRAVENOUS

## 2018-08-17 MED ORDER — SODIUM CHLORIDE 0.9 % IV SOLN
2.0000 mg/h | INTRAVENOUS | Status: DC
  Filled 2018-08-17: qty 5

## 2018-08-17 MED ORDER — ACETAMINOPHEN 325 MG PO TABS: 650.0000 mg | ORAL_TABLET | Freq: Four times a day (QID) | ORAL | Status: DC | PRN

## 2018-08-17 MED ORDER — SODIUM CHLORIDE 0.9 % IV SOLN
2.0000 mg/h | INTRAVENOUS | Status: DC
  Administered 2018-08-17: 2 mg/h via INTRAVENOUS
  Filled 2018-08-17: qty 5

## 2018-08-17 MED ORDER — ACETAMINOPHEN 650 MG RE SUPP: 650.0000 mg | Freq: Four times a day (QID) | RECTAL | Status: DC | PRN

## 2018-08-17 MED ORDER — POLYVINYL ALCOHOL 1.4 % OP SOLN
1.0000 [drp] | Freq: Four times a day (QID) | OPHTHALMIC | Status: DC | PRN
  Filled 2018-08-17: qty 15

## 2018-08-17 MED ORDER — BIOTENE DRY MOUTH MT LIQD: 15.0000 mL | OROMUCOSAL | Status: DC | PRN

## 2018-08-17 MED ORDER — JUVEN PO PACK
1.0000 | PACK | Freq: Two times a day (BID) | ORAL | Status: DC
  Filled 2018-08-17: qty 1

## 2018-08-17 MED ORDER — ONDANSETRON HCL 4 MG/2ML IJ SOLN: 4.0000 mg | Freq: Four times a day (QID) | INTRAMUSCULAR | Status: DC | PRN

## 2018-08-17 MED ORDER — HYDROMORPHONE BOLUS VIA INFUSION
1.0000 mg | INTRAVENOUS | Status: DC | PRN
  Filled 2018-08-17: qty 2

## 2018-08-17 MED ORDER — HYDROMORPHONE BOLUS VIA INFUSION
0.5000 mg | INTRAVENOUS | Status: DC | PRN
  Filled 2018-08-17: qty 1

## 2018-08-17 MED ORDER — SODIUM CHLORIDE 0.9 % IV SOLN: 2.0000 mg/h | INTRAVENOUS | Status: DC

## 2018-08-18 LAB — CULTURE, RESPIRATORY W GRAM STAIN: Gram Stain: NONE SEEN

## 2018-09-10 NOTE — Progress Notes (Signed)
Ventilator discontinued per physician order and family's wishes.  Pt is now for comfort care only.  Family at bedside.

## 2018-09-10 NOTE — Progress Notes (Signed)
Daily Progress Note   Patient Name: Loretta Bell       Date: Aug 18, 2018 DOB: 20-Mar-1940  Age: 78 y.o. MRN#: [id number] Attending Physician: Shelly Coss, MD Primary Care Physician: Gilford Silvius, MD Admit Date: 08/05/2018  Reason for Consultation/Follow-up: Establishing goals of care  Subjective: Patient remains unresponsive. She became hypotensive, tachycardic, and tachypneic on trach collar with oxygen saturations dropping into the 60's requiring ventilation. Hematuria noted. Anasarca with bilateral upper extremity oozing.    I spoke with husband and son at length about patient's continued decline. Updates provided and suggestions made to transition patient to comfort to eliminate suffering and continued medical care despite no signs of improvement. Husband tearful in conversation expressing he has watched her suffer for to long and does not want her to continue. Son agrees. Family discussed amongst themselves and have decided they would like to proceed with transitioning patient to full comfort.   I re-discussed at length with family what comfort measures would look like. They verbalized awareness that patient would be taken off of the ventilator and placed on trach collar for comfort and support. We discussed managing symptoms with a Dilaudid drip with as needed bolused and medications for anxiety, secretions, and nausea. Family verbalized understanding and appreciation. We discussed the same 4 family members that visited yesterday were allowed to visit today for EOL support. Family is aware that once patient is taken off ventilator family members would then visit 2 at a time while others waited in the waiting area.   Family requesting not to transition to comfort until they arrived  around 3pm.   1505: Family at the bedside. We again reviewed visitation policy with nurse. Also reviewed plan for transitioning to comfort and medications that will be ordered. Husband spent some time alone with patient to express feelings and find peace and comfort with their decision. Support given and [place] also at the bedside offering spiritual support. Husband tearful and loving on patient (holding hand and kissing forehead). He verbalized wishes to proceed with comfort expressing he no longer wish for her to suffer. All family in agreement. Patient was extubated once drip initiated and family was prepared.   Family tearful and sharing memories of patient. 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 Emotional support provided. Youngest  grandson who is out of town was able to facetime in and say his goodbyes.   Family appreciative of care and support during this difficult time. Remained on unit and supported family until patient passed away. Emotional support and therapeutic listening provided. Family tearful and again appreciative of all medical team's support and compassion.   Length of Stay: 8  Current Medications: Scheduled Meds:  . chlorhexidine gluconate (MEDLINE KIT)  15 mL Mouth Rinse BID  . Chlorhexidine Gluconate Cloth  6 each Topical Daily  . cholecalciferol  1,000 Units Per Tube Daily  . citalopram  10 mg Per Tube Daily  . clonazePAM  0.25 mg Per Tube BID  . famotidine  20 mg Per Tube QHS  . feeding supplement (PRO-STAT SUGAR FREE 64)  30 mL Per Tube TID  . folic acid  0.5 mg Per Tube Daily  . insulin aspart  0-9 Units Subcutaneous Q4H  . insulin aspart  2 Units Subcutaneous Q4H  . latanoprost  1 drop Both Eyes QHS  . levofloxacin  500 mg Per Tube Q48H  . loratadine  10 mg Per Tube Daily  . mouth rinse  15 mL Mouth Rinse 10 times per day  . modafinil  100 mg Per Tube Daily  . polyethylene glycol  17 g Per Tube Daily  .  QUEtiapine  50 mg Per Tube BID  . timolol  1 drop Both Eyes BID    Continuous Infusions: . sodium chloride 10 mL/hr at 08/14/18 1700  . feeding supplement (OSMOLITE 1.2 CAL) 1,000 mL (08-29-2018 1015)    PRN Meds: sodium chloride, fentaNYL (SUBLIMAZE) injection, morphine injection, oxyCODONE  Physical Exam  General:morbidily obese, chronically-ill appearing, trach collar Cardiovascular: irregular (a-fib), bilateral lower extremity pitting edema Pulmonary: diminished  Abdomen: soft, + bowel sounds GU: foley in place, tea colored urine  Extremities:anasarca, weeping requiring bandage and wraps, pitting edema Skin: bilateral upper extremity oozing, ecchymosis, petechiae  Neurological: Unresponsive, will not follow commands        Vital Signs: BP (!) 94/42   Pulse (!) 109   Temp 98.7 F (37.1 C) (Oral)   Resp 20   Ht '5\' 2"'$  (1.575 m)   Wt 105.3 kg   LMP  (LMP Unknown)   SpO2 99%   BMI 42.46 kg/m  SpO2: SpO2: 99 % O2 Device: O2 Device: Ventilator O2 Flow Rate: O2 Flow Rate (L/min): 5 L/min  Intake/output summary:   Intake/Output Summary (Last 24 hours) at August 29, 2018 1340 Last data filed at 08-29-2018 0800 Gross per 24 hour  Intake 1160.37 ml  Output 462 ml  Net 698.37 ml   LBM: Last BM Date: 08/29/2018 Baseline Weight: Weight: 106.2 kg Most recent weight: Weight: 105.3 kg       Palliative Assessment/Data: VENTILATION TRANSITIONING TO FULL COMFORT       Patient Active Problem List   Diagnosis Date Noted  . Pressure injury of skin 08/14/2018  . Acute respiratory failure ([place]) 08/08/2018  . Acute on chronic respiratory failure with hypoxia ([place])   . Chronic atrial fibrillation   . Chronic kidney disease, stage IV (severe) ([place])   . Pulmonary alveolar hemorrhage   . Acute on chronic diastolic heart failure [facility])     Palliative Care Assessment & Plan   Patient Profile: Palliative Care consult requested for this 78 y.o. female with multiple medical problems  including chronic respiratory failure (trach collar at [facility]), tracheostomy, PEG, atrial fibrillation, aortic valve endocarditis, CKD Stage IV, DM, HTN, CHF (EF 60-65% 02/2018),  pulmonary hemorrhage, bedbound since 02/2018, and UTIs. She presented to ED from [facility] due to acute low oxygen saturations x1 day and placed back on the ventilator. Also noted increased pitting edema and worsening renal function. Since admission patient continues to have episodes of respiratory distress requiring ventilation. She is on Levaquin due to tracheal aspirate showing enterococcus faecium. PMT consult for goals of care discussions.   Recommendations/Plan:  DNR/DNI  FULL COMFORT MEASURES at family's request  Family aware to anticipate hospital death (minutes to hours)  D/C vent and place on low % trach collar for comfort only! No escalation  D/C all orders not comfort focused   Dilaudid drip (titratable for comfort/symptom management) with infusion boluses  Ativan PRN for anxiety/agitation  Robinul PRN for excessive secretions  Liquifilm tears PRN for dry eyes  Oral care PRN  Comfort tray for family   PMT will support   Goals of Care and Additional Recommendations:  Limitations on Scope of Treatment: Full Comfort Care  Code Status:    Code Status Orders  (From admission, onward)         Start     Ordered   08/14/18 1135  Do not attempt resuscitation (DNR)  Continuous    Question Answer Comment  In the event of cardiac or respiratory ARREST Do not call a "code blue"   In the event of cardiac or respiratory ARREST Do not perform Intubation, CPR, defibrillation or ACLS   In the event of cardiac or respiratory ARREST Use medication by any route, position, wound care, and other measures to relive pain and suffering. May use oxygen, suction and manual treatment of airway obstruction as needed for comfort.   Comments continue ventilatory support; patient came with completed state [place] DNR  form      08/14/18 1134        Code Status History    Date Active Date Inactive Code Status Order ID Comments User Context   08/10/2018 1214 08/14/2018 1134 Partial Code [id number]  Cleophas Dunker, DO Inpatient   08/10/2018 0324 08/10/2018 1213 Partial Code [id number]  Arnell Asal, NP Inpatient   07/20/2018 2157 08/10/2018 0324 Partial Code [id number]  Arnell Asal, NP ED   07/01/2018 1841 07/02/2018 1738 Full Code [id number]  Esmond Camper Inpatient   Advance Care Planning Activity      Prognosis:   Minutes to hours in the setting of full comfort care.   Discharge Planning:  Anticipated Hospital Death  Care plan was discussed with patient's family, Rod Holler, RN, and Dr. Tawanna Solo, Dr. Valeta Harms.   Thank you for allowing the Palliative Medicine Team to assist in the care of this patient.  Time: 1250-1330 Time Out: 1505-1710 Total Time: 175 min.   Greater than 50%  of this time was spent counseling and coordinating care related to the above assessment and plan   Please contact Palliative Medicine Team phone at [phone] for questions and concerns.

## 2018-09-10 NOTE — Progress Notes (Signed)
Chaplain responded to call to room for EOL.  Husband Fritz Pickerel present bedside. Chaplain offered words of support and comfort.  Chaplain offered prayer.  Chaplain offered blessing to family in waiting area as well. Will continue to be available if needed. Rev. Tamsen Snider Pager [phone]

## 2018-09-10 NOTE — Progress Notes (Signed)
Patient's urine output has been yellow/clear and diminished all night (~61mL/hour) but has now changed to a bright red color with increased output (~150 mL/hour). [place] notified. Will continue to monitor.   Weldon Inches, RN

## 2018-09-10 NOTE — Progress Notes (Signed)
 Nutrition Follow-up  DOCUMENTATION CODES:   Morbid obesity  INTERVENTION:   Tube Feeding:  Osmolite 1.2 at 60 ml/hr Pro-Stat 30 mL TID Provides 125 g of protein, 2028 kcals, 1166 mL of free water Meets 100% estimated calorie and protein needs  Add Juven BID, each packet provides 80 calories, 8 grams of carbohydrate, 2.5  grams of protein (collagen), 7 grams of L-arginine and 7 grams of L-glutamine; supplement contains CaHMB, Vitamins C, E, B12 and Zinc to promote wound healing   NUTRITION DIAGNOSIS:   Inadequate oral intake related to acute illness as evidenced by NPO status.  Being addressed via TF  GOAL:   Provide needs based on ASPEN/SCCM guidelines  Met  MONITOR:   TF tolerance, Vent status, Labs, Weight trends, Skin  REASON FOR ASSESSMENT:   Consult Enteral/tube feeding initiation and management  ASSESSMENT:   78 yo female admitted with acute on chronic respiratory failure likely secondary to pulmonary edema in setting of acute on chronic CHF. Pt with chronic trach, G-tube, CKD, CHF, DM, HTN  Pt with significant respiratory distress this AM and placed back on vent support, previously on trach collar but still with respiratory distress  Pt now a DNR, Awaiting family decision regarding further poc  Tolerating Osmolite 1.2 at 84, Pro-Stat 30 mL x 3 via G-tube  Current wt 105.3 kg; admission weight 106.2 kg; weight has fluctuated between 105-107 kg since admission  7/1 Vitamin C 0.9 (wdl) 7/1 Zinc 57 (wdl, low end)   Labs:  CBGs 115-250 Meds: Vitamin D3, folic acid, ss novolog, novolog q 4 hours  Diet Order:   Diet Order            Diet NPO time specified  Diet effective now              EDUCATION NEEDS:   Not appropriate for education at this time  Skin:  Skin Assessment: Skin Integrity Issues: Skin Integrity Issues:: Unstageable, Other (Comment) Unstageable: anus Other: significant ecchymosis/petichiae face, abdomen, chest, b/l legs, b/l  arms, back  Last BM:  7/1 rectal tube  Height:   Ht Readings from Last 1 Encounters:  08/07/2018 '5\' 2"'$  (1.575 m)    Weight:   Wt Readings from Last 1 Encounters:  09-16-18 105.3 kg    Ideal Body Weight:  50 kg  BMI:  Body mass index is 42.46 kg/m.  Estimated Nutritional Needs:   Kcal:  1900-2100 kcals  Protein:  100-125 g  Fluid:  >/= 1.5 L    Maisee Vollman MS, RDN, LDN, CNSC [phone] Pager  [phone] Weekend/On-Call Pager

## 2018-09-10 NOTE — Progress Notes (Signed)
PROGRESS NOTE    Loretta Bell  MRN:[id number] DOB: 10/08/1940 DOA: 07/23/2018 PCP: Hirata, Takashi, MD   Brief Narrative: Patient is a 78-year-old female with history of chronic hypoxic respiratory failure on trach collar at [facility], A. fib, history of aortic valve endocarditis, CKD stage IV, diabetes mellitus, hypertension, congestive heart failure, history of pulmonary hemorrhage, UTIs who was sent from [facility] due to desaturation.  She was sent to the emergency department on vent.  Nursing staff reported that she had increased bilateral lower extremity edema including anasarca.  She was not on diuretics on [facility].  Patient was admitted under PCCM service.  She was diuresed with IV Lasix.  Patient handed over to [facility] on 08/11/2018.  08/16/18: Family meeting held today at the bedside.  PCCM, Palliative care  was present.  As per now, patient remains DNR.  Family thinks they need more discussion about comfort care initiation.PCCM does not recommend to be placed back on mechanical life support. Patient seen and examined the bedside this morning.  Currently on trach collar but is on 2 L of oxygen per minute.  Continues to remain in moderate respiratory distress.  Tracheal aspirate showed few Enterococcus faecium .Started on Levaquin.  Lasix has been held due to hypotension.  08/30/2018: Early this morning, patient became hypotensive and tachycardic.  She went into his significant respiratory distress with desaturation.  Patient had to be placed back on vent .  Assessment & Plan:   Active Problems:   Acute respiratory failure (HCC)   Pressure injury of skin   Acute on chronic hypoxic respiratory failure: Most likely secondary to volume overload secondary to congestive heart failure. She is on trach collar on baseline.    Suspected pneumonia: Initially Started on vancomycin and meropenem.  She has history of enterococcus bacteremia and also drug-resistant UTI.   She was started on cefepime at  [facility] on 08/06/2018.  Discontinued the antibiotics .Tracheal aspirate showed few Enterococcus faecium .Started on levaquin.We will follow final culture report.  Acute on chronic congestive heart failure : Echocardiogram done here showed reduced ejection fraction of 35 to 40%. Likely chronic finding. Patient not on Lasix at baseline.    Continue carvedilol.  Discontinued Cardizem.  Not started on ACE/ARB due to AKI. IV lasix discontinued due to hypotension.  History of recent pulmonary hemorrhage: Reported at outside hospital, prior hospitalization on 6/20.  On Eliquis which has been held due to thrombocytopenia, ecchymosis  Normocytic anemia: Stable.  Continue to monitor CBC.  Thrombocytopenia: Has diffusely scattered petechiae and ecchymosis.  Eliquis has been held.  Smear without schistocytes.  AKI on CKD stage III: Lasix on hold.Creatinine in the range of 2.Check BMP tomorrow  Hyperlipidemia: Was On Lipitor, gemfibrozil  Glaucoma: Continue eyedrops  Chronic A. fib: Currently rate is controlled.  Was on  carvedilol.  Eliquis held due to anemia, thrombocytopenia and petechiae/ecchymosis.S/P pacemaker.  Diabetes mellitus: Continue insulin as ordered.   Hypertension: Was On Coreg, hydralazine .  Antihypertensives discontinued due to hypotension at present.  Debility/deconditioning/multiple comorbidities: Palliative care following.  Family discussing him among themselves  for  final decision on comfort care/removal of oxygen supplementation.Currently no plan for escalation of care.  She is DNR.  She had to be put on vent for respiratory distress this morning. put on vent for respiratory distress this morning.  Nutrition Problem: Inadequate oral intake Etiology: acute illness      DVT prophylaxis: SCD Code Status: DNR Family Communication: Goal of care discussion meeting on 08/16/18 with family. Disposition Plan: Undetermined for now.  Waiting   for family's final decision about initiation of comfort care  Consultants: PCCM   Procedures: None Antimicrobials:  Anti-infectives (From admission, onward)   Start     Dose/Rate Route Frequency Ordered Stop   08/16/18 1200  levofloxacin (LEVAQUIN) 25 MG/ML solution 500 mg     500 mg Per Tube Every 48 hours 08/16/18 1134     08/10/18 2200  vancomycin (VANCOCIN) 1,250 mg in sodium chloride 0.9 % 250 mL IVPB  Status:  Discontinued     1,250 mg 166.7 mL/hr over 90 Minutes Intravenous Every 24 hours 07/23/2018 2228 08/12/18 1344   07/13/2018 2230  vancomycin (VANCOCIN) 1,500 mg in sodium chloride 0.9 % 500 mL IVPB     1,500 mg 250 mL/hr over 120 Minutes Intravenous  Once 07/26/2018 2228 08/10/18 0235   07/12/2018 2230  meropenem (MERREM) 1 g in sodium chloride 0.9 % 100 mL IVPB  Status:  Discontinued     1 g 200 mL/hr over 30 Minutes Intravenous Every 12 hours 08/06/2018 2228 08/12/18 1344      Subjective: Patient seen and examined the bedside this morning.  She had to be put on vent due to desaturation and respiratory distress.  During my evaluation she was found to be hemodynamically stable.  She also briefly opened her eyes once I call her name.  No significant other changes since yesterday.  Objective: Vitals:   07-Sep-2018 1000 Sep 07, 2018 1058 September 07, 2018 1100 07-Sep-2018 1200  BP: (!) 100/55 (!) 100/55 (!) 94/42   Pulse: 93 (!) 102 (!) 106 (!) 109  Resp: (!) _0 Temp:      TempSrc:      SpO2: 100%  99% 99%  Weight:      Height:        Intake/Output Summary (Last 24 hours) at 07-Sep-2018 1311 Last data filed at 07-Sep-2018 0800 Gross per 24 hour  Intake 1160.37 ml  Output 462 ml  Net 698.37 ml   Filed Weights   08/15/18 0600 08/16/18 0139 Sep 07, 2018 0500  Weight: 105.6 kg 105 kg 105.3 kg    Examination:   General exam: Critically ill, morbidly obese  HEENT: Trach Respiratory system: On ventilator, bilateral decreased air entry Cardiovascular system: A. fib  gastrointestinal system: Abdomen is nondistended, soft and nontender.  PEG Central nervous system: Not  alert and orient Extremities: Severe bilateral lower extremity edema, anasarca Skin: Diffuse ecchymosis, petechiae, oozing  Data Reviewed: I have personally reviewed following labs and imaging studies  CBC: Recent Labs  Lab 08/11/18 0446 08/14/18 0305 08/15/18 0445 08/16/18 0723 07-Sep-2018 0900  WBC 13.7* 9.0 7.2 8.9 10.4  NEUTROABS  --  7.3 5.5 8.0* 8.8*  HGB 8.3* 7.6* 7.2* 7.5* 7.6*  HCT 26.6* 24.7* 23.3* 23.8* 24.7*  MCV 96.4 99.2 98.7 98.8 98.4  PLT 88* 116* 128* 149* 976   Basic Metabolic Panel: Recent Labs  Lab 08/10/18 1600  08/11/18 0446 08/11/18 2244 08/12/18 0352 08/13/18 0523 08/14/18 0305 08/14/18 1438 08/16/18 0610  NA  --    < > 130*  --  133* 135 136 137 138  K  --    < > 3.6  --  4.2 4.3 4.4 4.2 4.8  CL  --    < > 96*  --  95* 97* 99 97* 99  CO2  --    < > 27  --  _1 GLUCOSE  --    < > 281*  --  162* 188* 136* 173*  112*  BUN  --    < > 88*  --  95* 107* 115* 119* 145*  CREATININE  --    < > 1.49*  --  1.50* 1.59* 1.52* 1.66* 2.02*  CALCIUM  --    < > 8.2*  --  8.4* 8.5* 8.6* 8.7* 8.6*  MG 2.4  --  2.1 2.2  --   --   --   --   --   PHOS 3.7  --  3.9  --   --   --   --   --   --    < > = values in this interval not displayed.   GFR: Estimated Creatinine Clearance: 27 mL/min (A) (by C-G formula based on SCr of 2.02 mg/dL (H)). Liver Function Tests: No results for input(s): AST, ALT, ALKPHOS, BILITOT, PROT, ALBUMIN in the last 168 hours. No results for input(s): LIPASE, AMYLASE in the last 168 hours. No results for input(s): AMMONIA in the last 168 hours. Coagulation Profile: Recent Labs  Lab 08/12/18 0352 08/12/2018 0900  INR 1.3* 1.3*   Cardiac Enzymes: No results for input(s): CKTOTAL, CKMB, CKMBINDEX, TROPONINI in the last 168 hours. BNP (last 3 results) No results for input(s): PROBNP in the last 8760 hours. HbA1C: No results for input(s): HGBA1C in the last 72 hours. CBG: Recent Labs  Lab 08/16/18 2028 09/08/2018 0046  09/09/2018 0419 09/09/2018 0741 08/16/2018 1131  GLUCAP 189* 250* 236* 162* 153*   Lipid Profile: No results for input(s): CHOL, HDL, LDLCALC, TRIG, CHOLHDL, LDLDIRECT in the last 72 hours. Thyroid Function Tests: No results for input(s): TSH, T4TOTAL, FREET4, T3FREE, THYROIDAB in the last 72 hours. Anemia Panel: No results for input(s): VITAMINB12, FOLATE, FERRITIN, TIBC, IRON, RETICCTPCT in the last 72 hours. Sepsis Labs: No results for input(s): PROCALCITON, LATICACIDVEN in the last 168 hours.  Recent Results (from the past 240 hour(s))  Urine Culture     Status: Abnormal   Collection Time: 07/18/2018  4:15 PM   Specimen: Urine, Catheterized  Result Value Ref Range Status   Specimen Description URINE, CATHETERIZED  Final   Special Requests   Final    NONE Performed at [facility], [address] [place], [place] [address]    Culture 80,000 COLONIES/mL YEAST (A)  Final   Report Status 08/11/2018 FINAL  Final  SARS Coronavirus 2 (CEPHEID - Performed in [facility] lab), Hosp Order     Status: None   Collection Time: 08/01/2018  4:42 PM   Specimen: Nasopharyngeal Swab  Result Value Ref Range Status   SARS Coronavirus 2 NEGATIVE NEGATIVE Final    Comment: (NOTE) If result is NEGATIVE SARS-CoV-2 target nucleic acids are NOT DETECTED. The SARS-CoV-2 RNA is generally detectable in upper and lower  respiratory specimens during the acute phase of infection. The lowest  concentration of SARS-CoV-2 viral copies this assay can detect is 250  copies / mL. A negative result does not preclude SARS-CoV-2 infection  and should not be used as the sole basis for treatment or other  patient management decisions.  A negative result may occur with  improper specimen collection / handling, submission of specimen other  than nasopharyngeal swab, presence of viral mutation(s) within the  areas targeted by this assay, and inadequate number of viral copies  (<250 copies / mL). A negative  result must be combined with clinical  observations, patient history, and epidemiological information. If result is POSITIVE SARS-CoV-2 target nucleic acids are DETECTED. The   SARS-CoV-2 RNA is generally detectable in upper and lower  respiratory specimens dur ing the acute phase of infection.  Positive  results are indicative of active infection with SARS-CoV-2.  Clinical  correlation with patient history and other diagnostic information is  necessary to determine patient infection status.  Positive results do  not rule out bacterial infection or co-infection with other viruses. If result is PRESUMPTIVE POSTIVE SARS-CoV-2 nucleic acids MAY BE PRESENT.   A presumptive positive result was obtained on the submitted specimen  and confirmed on repeat testing.  While 2019 novel coronavirus  (SARS-CoV-2) nucleic acids may be present in the submitted sample  additional confirmatory testing may be necessary for epidemiological  and / or clinical management purposes  to differentiate between  SARS-CoV-2 and other Sarbecovirus currently known to infect humans.  If clinically indicated additional testing with an alternate test  methodology ([id number]) is advised. The SARS-CoV-2 RNA is generally  detectable in upper and lower respiratory sp ecimens during the acute  phase of infection. The expected result is Negative. Fact Sheet for Patients:  https://www.fda.gov/media/136312/download Fact Sheet for Healthcare Providers: https://www.fda.gov/media/136313/download This test is not yet approved or cleared by the [place] FDA and has been authorized for detection and/or diagnosis of SARS-CoV-2 by FDA under an Emergency Use Authorization (EUA).  This EUA will remain in effect (meaning this test can be used) for the duration of the COVID-19 declaration under Section 564(b)(1) of the Act, 21 U.S.C. section 360bbb-3(b)(1), unless the authorization is terminated or revoked sooner. Performed at [facility], [address] [place], [place] [address]   MRSA PCR Screening     Status: None   Collection Time: 08/10/18 12:16 AM   Specimen: Nasal Mucosa; Nasopharyngeal  Result Value Ref Range Status   MRSA by PCR NEGATIVE NEGATIVE Final    Comment:        The GeneXpert MRSA Assay (FDA approved for NASAL specimens only), is one component of a comprehensive MRSA colonization surveillance program. It is not intended to diagnose MRSA infection nor to guide or monitor treatment for MRSA infections. Performed at [facility], [address] [place], [place] [address]   Culture, blood (Routine X 2) w Reflex to ID Panel     Status: None   Collection Time: 08/10/18  5:03 AM   Specimen: BLOOD RIGHT HAND  Result Value Ref Range Status   Specimen Description BLOOD RIGHT HAND  Final   Special Requests   Final    BOTTLES DRAWN AEROBIC ONLY Blood Culture results may not be optimal due to an inadequate volume of blood received in culture bottles   Culture   Final    NO GROWTH 5 DAYS Performed at [facility], [address] [place], [place] [address]    Report Status 08/15/2018 FINAL  Final  Culture, blood (routine x 2)     Status: None   Collection Time: 08/10/18  5:05 AM   Specimen: BLOOD  Result Value Ref Range Status   Specimen Description BLOOD PICC LINE  Final   Special Requests   Final    BOTTLES DRAWN AEROBIC ONLY Blood Culture results may not be optimal due to an inadequate volume of blood received in culture bottles   Culture   Final    NO GROWTH 5 DAYS Performed at  Hospital Lab, [address] [place],  [address]    Report Status 08/15/2018 FINAL  Final  Expectorated sputum assessment w rflx   to resp cult     Status: None   Collection Time: 08/14/18  9:17 AM   Specimen: Tracheal Aspirate; Sputum  Result Value Ref Range Status   Specimen Description TRACHEAL ASPIRATE  Final   Special Requests NONE  Final   Sputum evaluation   Final    THIS  SPECIMEN IS ACCEPTABLE FOR SPUTUM CULTURE Performed at [facility], [address], [place], [place] [address]    Report Status 08/14/2018 FINAL  Final  Culture, respiratory     Status: None (Preliminary result)   Collection Time: 08/14/18  9:17 AM   Specimen: Tracheal Aspirate  Result Value Ref Range Status   Specimen Description TRACHEAL ASPIRATE  Final   Special Requests NONE Reflexed from [id number]  Final   Gram Stain NO WBC SEEN FEW GRAM POSITIVE COCCI IN PAIRS   Final   Culture   Final    FEW ENTEROCOCCUS FAECIUM SUSCEPTIBILITIES TO FOLLOW CULTURE REINCUBATED FOR BETTER GROWTH Performed at [facility], [place] [address] [place], [place] [address]    Report Status PENDING  Incomplete         Radiology Studies: No results found.      Scheduled Meds: . carvedilol  12.5 mg Oral BID WC  . chlorhexidine gluconate (MEDLINE KIT)  15 mL Mouth Rinse BID  . Chlorhexidine Gluconate Cloth  6 each Topical Daily  . cholecalciferol  1,000 Units Per Tube Daily  . citalopram  10 mg Per Tube Daily  . clonazePAM  0.25 mg Per Tube BID  . famotidine  20 mg Per Tube QHS  . feeding supplement (PRO-STAT SUGAR FREE 64)  30 mL Per Tube TID  . folic acid  0.5 mg Per Tube Daily  . hydrALAZINE  25 mg Per Tube BID  . insulin aspart  0-9 Units Subcutaneous Q4H  . insulin aspart  2 Units Subcutaneous Q4H  . latanoprost  1 drop Both Eyes QHS  . levofloxacin  500 mg Per Tube Q48H  . loratadine  10 mg Per Tube Daily  . mouth rinse  15 mL Mouth Rinse 10 times per day  . modafinil  100 mg Per Tube Daily  . polyethylene glycol  17 g Per Tube Daily  . QUEtiapine  50 mg Per Tube BID  . timolol  1 drop Both Eyes BID   Continuous Infusions: . sodium chloride 10 mL/hr at 08/14/18 1700  . feeding supplement (OSMOLITE 1.2 CAL) 1,000 mL (September 03, 2018 1015)     LOS: 8 days    Time spent: 35 mins.      Shelly Coss, MD [facility] Pager [phone]  If 7PM-7AM, please  contact night-coverage [email or web address] Password [email or web address] 09/03/2018, 1:11 PM

## 2018-09-10 NOTE — Progress Notes (Signed)
Trach collar trial ended due to significant desaturation and agonal looking respirations by patient.  Pt placed on vent and o2 saturations improved quickly.

## 2018-09-10 NOTE — Progress Notes (Signed)
[facility] Progress Note Patient Name: Loretta Bell DOB: [birth year] MRN: [id number]   Date of Service  08-30-18  HPI/Events of Note  Multiple issues: 1. 150 mL BRB from Foley - Hx of renal calculi and 2. Oliguria - LVEF = 35-40%.  eICU Interventions  Will order: 1. Bolus with 0.9 NaCl 250 mL IV over 30 minutes.  2. CBC with platelets, PT/INR and PTT now.      Intervention Category Major Interventions: Other: Intermediate Interventions: Oliguria - evaluation and management  Sommer,Steven Eugene Aug 30, 2018, 6:25 AM

## 2018-09-10 NOTE — Death Summary Note (Addendum)
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 fib, history of aortic valve endocarditis, CKD stage IV, diabetes mellitus, hypertension, congestive heart failure, history of pulmonary hemorrhage, UTIs who was sent from [facility] due to desaturation.  She was sent to the emergency department on vent.  Nursing staff reported that she had increased bilateral lower extremity edema including anasarca.  She was not on diuretics on [facility].  Patient was admitted under PCCM service.  She was diuresed with IV Lasix.  Patient handed over to [facility] on 08/11/2018. Patient's respiratory status continued to deteriorate.  She cudnt be liberated from from vent. PCCM did not recommend to be placed back on mechanical life support.  After discussion with family, we made her DNR.  On 2018/09/01,patient became hypotensive and tachycardic.  She went into his significant respiratory distress with desaturation.As pr family's wishes ventilator was disconnected and she died shortly after that  Final Diagnoses:  1.   Acute on chronic respiratory failure secondary to severe congestive heart failure   The results of significant diagnostics from this hospitalization (including imaging, microbiology, ancillary and laboratory) are listed below for reference.    Significant Diagnostic Studies: Dg Chest 1 View  Result Date: 08/14/2018 CLINICAL DATA:  Shortness of breath EXAM: CHEST  1 VIEW COMPARISON:  08/24/18 FINDINGS: Chronic cardiomegaly. Dual lead pacemaker appears the same. Pulmonary venous hypertension without frank edema. Left effusion with left base atelectasis and or pneumonia. IMPRESSION: Radiographic improvement.  Resolution of pulmonary edema. Pulmonary venous hypertension persists. Cardiomegaly. Left effusion and left base atelectasis and or pneumonia Electronically Signed   By: Nelson Chimes M.D.   On: 08/14/2018 09:45   Ct Head Wo Contrast  Result Date: 07/29/2018 CLINICAL DATA:  Altered LOC EXAM: CT HEAD WITHOUT CONTRAST TECHNIQUE: Contiguous axial images were obtained from the base of the skull through the vertex without intravenous contrast. COMPARISON:  None. FINDINGS: Brain: No acute territorial infarction, hemorrhage, or intracranial mass is visualized. Advanced atrophy. Mild small vessel ischemic changes of the white matter. Slightly enlarged ventricles felt secondary to atrophy. Vascular: No hyperdense vessels.  Carotid vascular calcification Skull: Normal. Negative for fracture or focal lesion. Sinuses/Orbits: No acute finding. Other: None IMPRESSION: 1. No CT evidence for acute intracranial abnormality. 2. Atrophy and small vessel ischemic changes of the white matter Electronically Signed   By: Donavan Foil M.D.   On: 07/29/2018 17:16   Dg Chest Port 1 View  Result Date: 24-Aug-2018 CLINICAL DATA:  Shortness of breath. EXAM: PORTABLE CHEST 1 VIEW COMPARISON:  07/29/2018 FINDINGS: The tracheostomy tube is stable.  The pacer wires are stable. Persistent cardiac enlargement and tortuous calcified thoracic aorta. Persistent asymmetric vascular congestion and patchy right lung infiltrates. Persistent left lower lobe density likely combination of effusion, atelectasis and infiltrate. IMPRESSION: 1. Stable tracheostomy tube. 2. Stable cardiac enlargement. 3. Persistent bilateral infiltrates and probable left effusion. Electronically Signed   By: Marijo Sanes M.D.   On: 24-Aug-2018 16:58   Dg Chest Port 1 View  Result Date: 07/29/2018 CLINICAL DATA:  Hypothermia. EXAM: PORTABLE CHEST 1 VIEW COMPARISON:  07/21/2018 and 07/01/2018 FINDINGS: There is persistent increased density at the left lung base with air  bronchograms consistent with consolidation. There may be  an underlying effusion. Overall heart size and vascularity are normal. Right lung is clear. Tracheostomy tube and pacemaker in place. Aortic atherosclerosis. IMPRESSION: Consolidation in the left lung base with possible left effusion, essentially unchanged. Aortic Atherosclerosis (ICD10-I70.0). Electronically Signed   By: Lorriane Shire M.D.   On: 07/29/2018 15:57   Dg Abd Portable 1 View  Result Date: 07/29/2018 CLINICAL DATA:  Altered level of consciousness.  Hypothermia. EXAM: PORTABLE ABDOMEN - 1 VIEW COMPARISON:  Radiographs 07/04/2018.  CT 07/02/2018. FINDINGS: 1645 hours. Two supine views obtained. There is a percutaneous G-tube with gas in the stomach. The abdomen is otherwise relatively gasless. No distended loops of bowel are identified. There is no supine evidence of free intraperitoneal air. Extensive aortic atherosclerosis is present. The patient has a pacemaker, a left pleural effusion and left basilar airspace disease. IMPRESSION: No definite acute findings. The abdomen is relatively gasless without apparent bowel distension. Electronically Signed   By: Richardean Sale M.D.   On: 07/29/2018 17:03    Microbiology: No results found for this or any previous visit (from the past 240 hour(s)).   Labs: Basic Metabolic Panel: No results for input(s): NA, K, CL, CO2, GLUCOSE, BUN, CREATININE, CALCIUM, MG, PHOS in the last 168 hours. Liver Function Tests: No results for input(s): AST, ALT, ALKPHOS, BILITOT, PROT, ALBUMIN in the last 168 hours. No results for input(s): LIPASE, AMYLASE in the last 168 hours. No results for input(s): AMMONIA in the last 168 hours. CBC: No results for input(s): WBC, NEUTROABS, HGB, HCT, MCV, PLT in the last 168 hours. Cardiac Enzymes: No results for input(s): CKTOTAL, CKMB, CKMBINDEX, TROPONINI in the last 168 hours. D-Dimer No results for input(s): DDIMER in the last 72 hours. BNP: Invalid input(s):  POCBNP CBG: No results for input(s): GLUCAP in the last 168 hours. Anemia work up No results for input(s): VITAMINB12, FOLATE, FERRITIN, TIBC, IRON, RETICCTPCT in the last 72 hours. Urinalysis    Component Value Date/Time   COLORURINE RED (A) 08/29/2018 0639   APPEARANCEUR TURBID (A) 2018/08/29 0639   LABSPEC  2018/08/29 0639    TEST NOT REPORTED DUE TO COLOR INTERFERENCE OF URINE PIGMENT   PHURINE  29-Aug-2018 0639    TEST NOT REPORTED DUE TO COLOR INTERFERENCE OF URINE PIGMENT   GLUCOSEU (A) 08-29-2018 0639    TEST NOT REPORTED DUE TO COLOR INTERFERENCE OF URINE PIGMENT   HGBUR (A) 2018-08-29 0639    TEST NOT REPORTED DUE TO COLOR INTERFERENCE OF URINE PIGMENT   BILIRUBINUR (A) 08-29-2018 0639    TEST NOT REPORTED DUE TO COLOR INTERFERENCE OF URINE PIGMENT   KETONESUR (A) 08/29/18 0639    TEST NOT REPORTED DUE TO COLOR INTERFERENCE OF URINE PIGMENT   PROTEINUR (A) 08/29/2018 0639    TEST NOT REPORTED DUE TO COLOR INTERFERENCE OF URINE PIGMENT   NITRITE (A) 2018-08-29 0639    TEST NOT REPORTED DUE TO COLOR INTERFERENCE OF URINE PIGMENT   LEUKOCYTESUR (A) 08-29-2018 0639    TEST NOT REPORTED DUE TO COLOR INTERFERENCE OF URINE PIGMENT   Sepsis Labs Invalid input(s): PROCALCITONIN,  WBC,  LACTICIDVEN     SIGNED:  Shelly Coss, MD  [facility] 08/24/2018, 3:18 PM Pager [phone]  If 7PM-7AM, please contact night-coverage [email or web address] Password [email or web address]

## 2018-09-10 NOTE — Progress Notes (Signed)
Post-mortem care completed. 67mls of Dilaudid wasted in the sink and witnessed with Alfredo Martinez.

## 2018-09-10 DEATH — deceased

## 2019-10-22 IMAGING — CR PORTABLE CHEST - 1 VIEW
1 series · 1 of 1 positions shown · non-contrast
Comparison: None

CLINICAL DATA: Cough.  Line placement.

EXAM:
PORTABLE CHEST 1 VIEW

[AP]
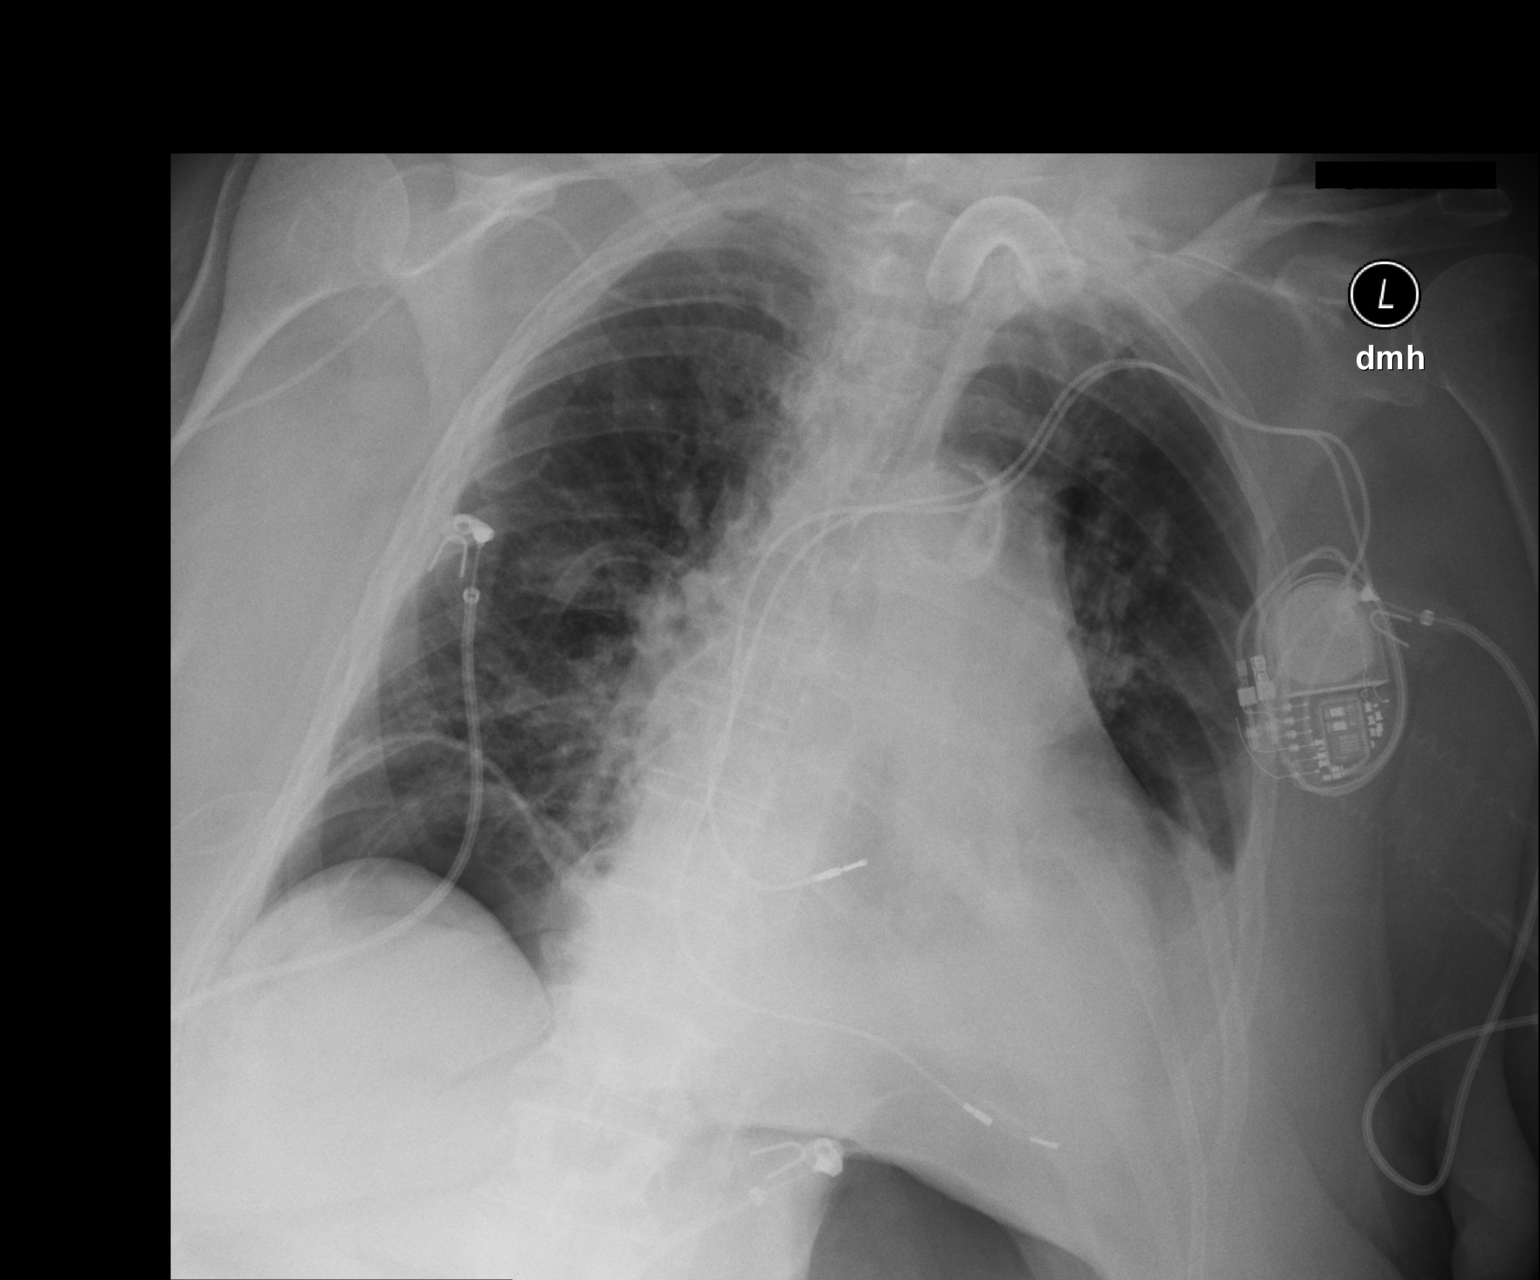

[1 of 1 positions shown; findings below may reference images not displayed]

FINDINGS: There is a right-sided PICC line with tip terminating near the
cavoatrial junction. The tracheostomy tube terminates above the
carina. There is a dual chamber pacemaker in place that appears
appropriate. T the cardiac silhouette is enlarged. There is a left
basilar airspace opacity. No pneumothorax. There is free air under
the hemidiaphragms.
IMPRESSION: 1. Free air under the hemidiaphragms highly suspicious for
pneumoperitoneum. Follow-up with CT scan is recommended.
2. Well-positioned right-sided PICC line. The tracheostomy tube
terminates above the carina.
3. Cardiomegaly.
4. Left basilar airspace opacity which may represent atelectasis
and/or a left-sided pleural effusion.

These results were called by telephone at the time of interpretation
on 07/01/2018 at [DATE] to Charge nurse Fernando Tomas who verbally
acknowledged these results.

## 2019-11-19 IMAGING — DX PORTABLE CHEST - 1 VIEW
1 series · 1 of 1 positions shown · non-contrast
Comparison: 07/21/2018 and 07/01/2018

CLINICAL DATA: Hypothermia.

EXAM:
PORTABLE CHEST 1 VIEW

[chest ap]
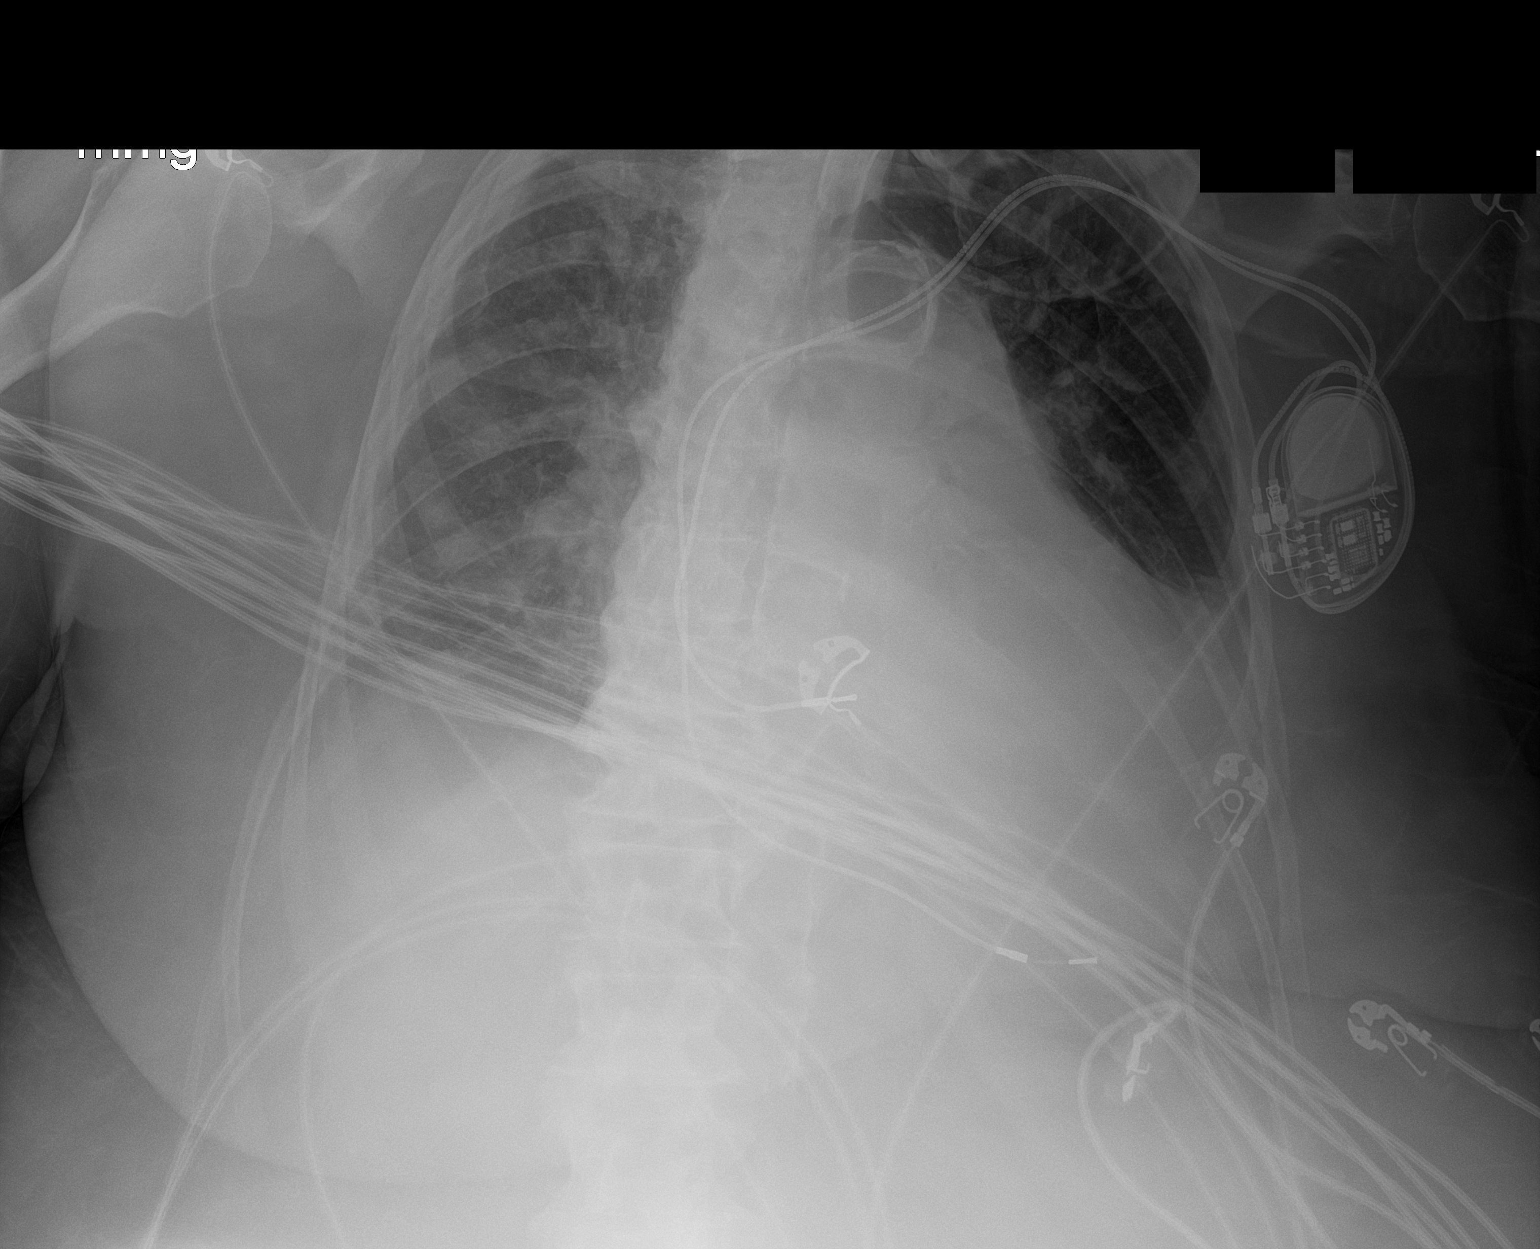

[1 of 1 positions shown; findings below may reference images not displayed]

FINDINGS: There is persistent increased density at the left lung base with air
bronchograms consistent with consolidation. There may be an
underlying effusion. Overall heart size and vascularity are normal.

Right lung is clear.

Tracheostomy tube and pacemaker in place.

Aortic atherosclerosis.
IMPRESSION: Consolidation in the left lung base with possible left effusion,
essentially unchanged.

Aortic Atherosclerosis ([id number]).
